# Patient Record
Sex: Male | Born: 1995
Health system: Southern US, Community
[De-identification: ages and names within clinical notes are randomized; demographics above are authoritative.]

## PROBLEM LIST (undated history)

## (undated) DIAGNOSIS — Z9889 Other specified postprocedural states: Secondary | ICD-10-CM

## (undated) DIAGNOSIS — I10 Essential (primary) hypertension: Secondary | ICD-10-CM

## (undated) DIAGNOSIS — E119 Type 2 diabetes mellitus without complications: Secondary | ICD-10-CM

## (undated) HISTORY — PX: TONSILLECTOMY: SUR1361

## (undated) HISTORY — PX: SHOULDER SURGERY: SHX246

## (undated) HISTORY — PX: OTHER SURGICAL HISTORY: SHX169

## (undated) HISTORY — PX: FIBULA FRACTURE SURGERY: SHX947

---
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
## 2014-02-19 ENCOUNTER — Ambulatory Visit (INDEPENDENT_AMBULATORY_CARE_PROVIDER_SITE_OTHER): Payer: Managed Care, Other (non HMO) | Admitting: Family Medicine

## 2014-02-19 ENCOUNTER — Encounter: Payer: Self-pay | Admitting: Family Medicine

## 2014-02-19 DIAGNOSIS — L03119 Cellulitis of unspecified part of limb: Secondary | ICD-10-CM

## 2014-02-19 DIAGNOSIS — L02419 Cutaneous abscess of limb, unspecified: Secondary | ICD-10-CM

## 2014-02-19 MED ORDER — DOXYCYCLINE HYCLATE 100 MG PO CAPS: 100.0000 mg | ORAL_CAPSULE | Freq: Two times a day (BID) | ORAL | Status: DC

## 2014-02-19 NOTE — Progress Notes (Signed)
   Subjective:    Patient ID: Garrett Lee, male    DOB: 07/27/1996, 18 y.o.   MRN: [id number]  HPI sytarted Sat worse on Sunday Upper left leg near groin Drained yesterday No fever He has not had any pain similar to this recently but he was concerned about a staph infection  Review of Systems Denies fevers vomiting sweats chills denies other problems    Objective:   Physical Exam  On examination he has an abscess in the left upper thigh near the groin it is adequately drained there is also to of area approximately three-eighths of an inch in diameter.      Assessment & Plan:  Abscess the area that is draining doxycycline twice a day for the next 10 days culture recommended proper way to take medication recommended also recommended to do warm compresses frequently if any problems followup

## 2014-02-21 ENCOUNTER — Ambulatory Visit: Payer: Self-pay | Admitting: Family Medicine

## 2014-02-22 LAB — WOUND CULTURE
GRAM STAIN: NONE SEEN
Gram Stain: NONE SEEN

## 2014-02-22 NOTE — Progress Notes (Signed)
Card sent 

## 2014-07-11 ENCOUNTER — Encounter: Payer: Self-pay | Admitting: Family Medicine

## 2014-07-11 ENCOUNTER — Ambulatory Visit (INDEPENDENT_AMBULATORY_CARE_PROVIDER_SITE_OTHER): Payer: Managed Care, Other (non HMO) | Admitting: Family Medicine

## 2014-07-11 VITALS — BP 120/80 | Ht 71.0 in | Wt 227.6 lb

## 2014-07-11 DIAGNOSIS — Z0000 Encounter for general adult medical examination without abnormal findings: Secondary | ICD-10-CM

## 2014-07-11 DIAGNOSIS — Z0189 Encounter for other specified special examinations: Secondary | ICD-10-CM

## 2014-07-11 DIAGNOSIS — R109 Unspecified abdominal pain: Secondary | ICD-10-CM

## 2014-07-11 LAB — CBC WITH DIFFERENTIAL/PLATELET
Basophils Absolute: 0.1 10*3/uL (ref 0.0–0.1)
Basophils Relative: 1 % (ref 0–1)
EOS ABS: 0.6 10*3/uL (ref 0.0–0.7)
EOS PCT: 7 % — AB (ref 0–5)
HCT: 49.6 % (ref 39.0–52.0)
Hemoglobin: 17.4 g/dL — ABNORMAL HIGH (ref 13.0–17.0)
LYMPHS ABS: 3.4 10*3/uL (ref 0.7–4.0)
Lymphocytes Relative: 38 % (ref 12–46)
MCH: 30.7 pg (ref 26.0–34.0)
MCHC: 35.1 g/dL (ref 30.0–36.0)
MCV: 87.6 fL (ref 78.0–100.0)
MONO ABS: 0.6 10*3/uL (ref 0.1–1.0)
MONOS PCT: 7 % (ref 3–12)
Neutro Abs: 4.2 10*3/uL (ref 1.7–7.7)
Neutrophils Relative %: 47 % (ref 43–77)
PLATELETS: 204 10*3/uL (ref 150–400)
RBC: 5.66 MIL/uL (ref 4.22–5.81)
RDW: 13.7 % (ref 11.5–15.5)
WBC: 8.9 10*3/uL (ref 4.0–10.5)

## 2014-07-11 LAB — HEPATIC FUNCTION PANEL
ALT: 59 U/L — ABNORMAL HIGH (ref 0–53)
AST: 26 U/L (ref 0–37)
Albumin: 4.7 g/dL (ref 3.5–5.2)
Alkaline Phosphatase: 95 U/L (ref 39–117)
Bilirubin, Direct: 0.1 mg/dL (ref 0.0–0.3)
Indirect Bilirubin: 0.4 mg/dL (ref 0.2–1.2)
Total Bilirubin: 0.5 mg/dL (ref 0.2–1.2)
Total Protein: 6.9 g/dL (ref 6.0–8.3)

## 2014-07-11 LAB — LIPASE: Lipase: 56 U/L (ref 0–75)

## 2014-07-11 MED ORDER — PANTOPRAZOLE SODIUM 40 MG PO TBEC: 40.0000 mg | DELAYED_RELEASE_TABLET | Freq: Every day | ORAL | Status: DC

## 2014-07-11 NOTE — Progress Notes (Signed)
   Subjective:    Patient ID: Garrett Lee, male    DOB: 23-Apr-1996, 18 y.o.   MRN: [id number]  HPI Patient arrives to have immunization forms filled out for college. Patient does relate intermittent abdominal pain discomfort. Denies high fever chills nausea vomiting diarrhea. Relates that whenever he eats he gets nausea with it relates mild epigastric pain no guarding or rebound.  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  Review of Systems Overall review of systems negative no cough either chills diarrhea or vomiting just nausea when he eats occasional epigastric pain    Objective:   Physical Exam Lungs clear heart regular pulse normal BP good abdomen soft extremities no edema skin warm dry HEENT benign       Assessment & Plan:  Gastritis-PPI prescribed. Lab work ordered. Await the results.  Wellness-safety measures dietary measures all discussed. Followup ongoing troubles. Up-to-date on immunizations. Need to do lab work to see if he has immunity to hepatitis B as well as parasellar

## 2014-07-12 LAB — TISSUE TRANSGLUTAMINASE, IGA: Tissue Transglutaminase Ab, IgA: 4.5 U/mL (ref <20)

## 2014-07-12 LAB — HEPATITIS B SURFACE ANTIBODY,QUALITATIVE: HEP B S AB: NEGATIVE

## 2014-07-12 LAB — VARICELLA ZOSTER ANTIBODY, IGG: VARICELLA IGG: 3224 {index} — AB (ref <135.00)

## 2014-07-12 LAB — TISSUE TRANSGLUTAMINASE, IGG: Tissue Transglut Ab: 4.9 U/mL (ref <20)

## 2014-07-15 NOTE — Progress Notes (Signed)
Patient notified and verbalized understanding. 

## 2015-02-13 ENCOUNTER — Telehealth: Payer: Self-pay | Admitting: Family Medicine

## 2015-02-13 ENCOUNTER — Emergency Department (HOSPITAL_COMMUNITY)
Admission: EM | Admit: 2015-02-13 | Discharge: 2015-02-14 | Disposition: A | Discharge status: Home / Self Care | Payer: Managed Care, Other (non HMO) | Attending: Emergency Medicine | Admitting: Emergency Medicine

## 2015-02-13 ENCOUNTER — Emergency Department (HOSPITAL_COMMUNITY): Payer: Managed Care, Other (non HMO)

## 2015-02-13 ENCOUNTER — Encounter: Payer: Self-pay | Admitting: Family Medicine

## 2015-02-13 ENCOUNTER — Encounter (HOSPITAL_COMMUNITY): Payer: Self-pay

## 2015-02-13 DIAGNOSIS — Z720 Tobacco use: Secondary | ICD-10-CM | POA: Insufficient documentation

## 2015-02-13 DIAGNOSIS — R1084 Generalized abdominal pain: Secondary | ICD-10-CM | POA: Diagnosis not present

## 2015-02-13 DIAGNOSIS — R112 Nausea with vomiting, unspecified: Secondary | ICD-10-CM | POA: Insufficient documentation

## 2015-02-13 DIAGNOSIS — R109 Unspecified abdominal pain: Secondary | ICD-10-CM

## 2015-02-13 DIAGNOSIS — Z79899 Other long term (current) drug therapy: Secondary | ICD-10-CM | POA: Insufficient documentation

## 2015-02-13 DIAGNOSIS — R197 Diarrhea, unspecified: Secondary | ICD-10-CM | POA: Insufficient documentation

## 2015-02-13 LAB — URINALYSIS, ROUTINE W REFLEX MICROSCOPIC
Bilirubin Urine: NEGATIVE
Glucose, UA: NEGATIVE mg/dL
Hgb urine dipstick: NEGATIVE
Ketones, ur: NEGATIVE mg/dL
LEUKOCYTES UA: NEGATIVE
Nitrite: NEGATIVE
PH: 6 (ref 5.0–8.0)
Protein, ur: NEGATIVE mg/dL
Specific Gravity, Urine: >1.03 — ABNORMAL HIGH (ref 1.005–1.030)
UROBILINOGEN UA: 0.2 mg/dL (ref 0.0–1.0)

## 2015-02-13 LAB — BASIC METABOLIC PANEL
ANION GAP: 9 (ref 5–15)
BUN: 16 mg/dL (ref 6–23)
CO2: 23 mmol/L (ref 19–32)
Calcium: 9.1 mg/dL (ref 8.4–10.5)
Chloride: 106 mmol/L (ref 96–112)
Creatinine, Ser: 0.97 mg/dL (ref 0.50–1.35)
GFR calc non Af Amer: >90 mL/min (ref >90)
Glucose, Bld: 151 mg/dL — ABNORMAL HIGH (ref 70–99)
Potassium: 3.7 mmol/L (ref 3.5–5.1)
SODIUM: 138 mmol/L (ref 135–145)

## 2015-02-13 LAB — HEPATIC FUNCTION PANEL
ALBUMIN: 4.8 g/dL (ref 3.5–5.2)
ALT: 65 U/L — ABNORMAL HIGH (ref 0–53)
AST: 36 U/L (ref 0–37)
Alkaline Phosphatase: 89 U/L (ref 39–117)
Bilirubin, Direct: 0.1 mg/dL (ref 0.0–0.5)
Indirect Bilirubin: 0.8 mg/dL (ref 0.3–0.9)
Total Bilirubin: 0.9 mg/dL (ref 0.3–1.2)
Total Protein: 7.4 g/dL (ref 6.0–8.3)

## 2015-02-13 LAB — CBC WITH DIFFERENTIAL/PLATELET
BASOS PCT: 0 % (ref 0–1)
Basophils Absolute: 0 10*3/uL (ref 0.0–0.1)
Eosinophils Absolute: 0.1 10*3/uL (ref 0.0–0.7)
Eosinophils Relative: 0 % (ref 0–5)
HEMATOCRIT: 50.9 % (ref 39.0–52.0)
HEMOGLOBIN: 17.9 g/dL — AB (ref 13.0–17.0)
LYMPHS ABS: 0.4 10*3/uL — AB (ref 0.7–4.0)
Lymphocytes Relative: 3 % — ABNORMAL LOW (ref 12–46)
MCH: 31.4 pg (ref 26.0–34.0)
MCHC: 35.2 g/dL (ref 30.0–36.0)
MCV: 89.3 fL (ref 78.0–100.0)
Monocytes Absolute: 1.2 10*3/uL — ABNORMAL HIGH (ref 0.1–1.0)
Monocytes Relative: 7 % (ref 3–12)
NEUTROS ABS: 15.7 10*3/uL — AB (ref 1.7–7.7)
NEUTROS PCT: 90 % — AB (ref 43–77)
Platelets: 186 10*3/uL (ref 150–400)
RBC: 5.7 MIL/uL (ref 4.22–5.81)
RDW: 12.6 % (ref 11.5–15.5)
WBC: 17.4 10*3/uL — ABNORMAL HIGH (ref 4.0–10.5)

## 2015-02-13 LAB — LIPASE, BLOOD: LIPASE: 26 U/L (ref 11–59)

## 2015-02-13 MED ORDER — SODIUM CHLORIDE 0.9 % IV SOLN: INTRAVENOUS | Status: DC

## 2015-02-13 MED ORDER — DICYCLOMINE HCL 20 MG PO TABS: 20.0000 mg | ORAL_TABLET | Freq: Four times a day (QID) | ORAL | Status: DC | PRN

## 2015-02-13 MED ORDER — FAMOTIDINE IN NACL 20-0.9 MG/50ML-% IV SOLN
20.0000 mg | Freq: Once | INTRAVENOUS | Status: AC
  Administered 2015-02-13: 20 mg via INTRAVENOUS
  Filled 2015-02-13: qty 50

## 2015-02-13 MED ORDER — ONDANSETRON HCL 4 MG PO TABS: 4.0000 mg | ORAL_TABLET | Freq: Three times a day (TID) | ORAL | Status: DC | PRN

## 2015-02-13 MED ORDER — ONDANSETRON HCL 4 MG/2ML IJ SOLN
4.0000 mg | INTRAMUSCULAR | Status: DC | PRN
  Administered 2015-02-13: 4 mg via INTRAVENOUS
  Filled 2015-02-13: qty 2

## 2015-02-13 MED ORDER — PROMETHAZINE HCL 25 MG PO TABS: 25.0000 mg | ORAL_TABLET | Freq: Three times a day (TID) | ORAL | Status: DC | PRN

## 2015-02-13 MED ORDER — IOHEXOL 300 MG/ML  SOLN
50.0000 mL | Freq: Once | INTRAMUSCULAR | Status: AC | PRN
  Administered 2015-02-13: 50 mL via ORAL

## 2015-02-13 MED ORDER — SODIUM CHLORIDE 0.9 % IV BOLUS (SEPSIS)
1000.0000 mL | Freq: Once | INTRAVENOUS | Status: AC
  Administered 2015-02-13: 1000 mL via INTRAVENOUS

## 2015-02-13 MED ORDER — IOHEXOL 300 MG/ML  SOLN
100.0000 mL | Freq: Once | INTRAMUSCULAR | Status: AC | PRN
  Administered 2015-02-13: 100 mL via INTRAVENOUS

## 2015-02-13 NOTE — Telephone Encounter (Signed)
Patient has contracted the stomach virus that his daughter [facility] was seen for earlier this week.  Can Thurmond ButtsWade have an excuse for college today?

## 2015-02-13 NOTE — Telephone Encounter (Signed)
Patient is having vomiting,diarrhea and nausea.

## 2015-02-13 NOTE — ED Provider Notes (Signed)
CSN: [id number]     Arrival date & time 02/13/15  1839 History   First MD Initiated Contact with Patient 02/13/15 2136     Chief Complaint  Patient presents with  . Emesis  . Abdominal Pain     HPI Pt was seen at 2145.  Per pt, c/o gradual onset and persistence of constant generalized abd "pain" since yesterday.  Has been associated with multiple intermittent episodes of N/V as well as generalized body "aches."  Describes the abd pain as generalized "cramping."  Denies diarrhea, no fevers, no back pain, no rash, no CP/SOB, no black or blood in stools or emesis.       History reviewed. No pertinent past medical history.   Past Surgical History  Procedure Laterality Date  . Fibula fracture surgery    . Shoulder surgery    . Tubes in ears      History  Substance Use Topics  . Smoking status: Current Every Day Smoker  . Smokeless tobacco: Not on file  . Alcohol Use: No    Review of Systems ROS: Statement: All systems negative except as marked or noted in the HPI; Constitutional: Negative for fever and chills. +generalized body aches.; ; Eyes: Negative for eye pain, redness and discharge. ; ; ENMT: Negative for ear pain, hoarseness, nasal congestion, sinus pressure and sore throat. ; ; Cardiovascular: Negative for chest pain, palpitations, diaphoresis, dyspnea and peripheral edema. ; ; Respiratory: Negative for cough, wheezing and stridor. ; ; Gastrointestinal: +N/V, abd pain. Negative for diarrhea, blood in stool, hematemesis, jaundice and rectal bleeding. . ; ; Genitourinary: Negative for dysuria, flank pain and hematuria. ; ; Musculoskeletal: Negative for back pain and neck pain. Negative for swelling and trauma.; ; Skin: Negative for pruritus, rash, abrasions, blisters, bruising and skin lesion.; ; Neuro: Negative for headache, lightheadedness and neck stiffness. Negative for weakness, altered level of consciousness , altered mental status, extremity weakness, paresthesias, involuntary  movement, seizure and syncope.     Allergies  Review of patient's allergies indicates no known allergies.  Home Medications   Prior to Admission medications   Medication Sig Start Date End Date Taking? Authorizing Provider  promethazine (PHENERGAN) 25 MG tablet Take 1 tablet (25 mg total) by mouth every 8 (eight) hours as needed for nausea or vomiting. 02/13/15  Yes Babs Sciara, MD  pantoprazole (PROTONIX) 40 MG tablet Take 1 tablet (40 mg total) by mouth daily. 07/11/14   Babs Sciara, MD   BP 128/85 mmHg  Pulse 109  Temp(Src) 99.7 F (37.6 C) (Oral)  Resp 17  Ht 6' (1.829 m)  Wt 225 lb (102.059 kg)  BMI 30.51 kg/m2  SpO2 96% Physical Exam  2150: Physical examination:  Nursing notes reviewed; Vital signs and O2 SAT reviewed;  Constitutional: Well developed, Well nourished, Well hydrated, In no acute distress; Head:  Normocephalic, atraumatic; Eyes: EOMI, PERRL, No scleral icterus; ENMT: Mouth and pharynx normal, Mucous membranes moist; Neck: Supple, Full range of motion, No lymphadenopathy; Cardiovascular: Regular rate and rhythm, No murmur, rub, or gallop; Respiratory: Breath sounds clear & equal bilaterally, No rales, rhonchi, wheezes.  Speaking full sentences with ease, Normal respiratory effort/excursion; Chest: Nontender, Movement normal; Abdomen: Soft, +generalized tenderness to palp. No rebound or guarding. Nondistended, Normal bowel sounds; Genitourinary: No CVA tenderness; Extremities: Pulses normal, No tenderness, No edema, No calf edema or asymmetry.; Neuro: AA&Ox3, Major CN grossly intact.  Speech clear. No gross focal motor or sensory deficits in extremities.; Skin: Color normal,  Warm, Dry.   ED Course  Procedures      EKG Interpretation None      MDM  MDM Reviewed: previous chart, nursing note and vitals Reviewed previous: labs Interpretation: labs and CT scan      Results for orders placed or performed during the hospital encounter of 02/13/15  CBC  with Differential  Result Value Ref Range   WBC 17.4 (H) 4.0 - 10.5 K/uL   RBC 5.70 4.22 - 5.81 MIL/uL   Hemoglobin 17.9 (H) 13.0 - 17.0 g/dL   HCT 16.150.9 09.639.0 - 04.552.0 %   MCV 89.3 78.0 - 100.0 fL   MCH 31.4 26.0 - 34.0 pg   MCHC 35.2 30.0 - 36.0 g/dL   RDW 40.912.6 81.111.5 - 91.415.5 %   Platelets 186 150 - 400 K/uL   Neutrophils Relative % 90 (H) 43 - 77 %   Neutro Abs 15.7 (H) 1.7 - 7.7 K/uL   Lymphocytes Relative 3 (L) 12 - 46 %   Lymphs Abs 0.4 (L) 0.7 - 4.0 K/uL   Monocytes Relative 7 3 - 12 %   Monocytes Absolute 1.2 (H) 0.1 - 1.0 K/uL   Eosinophils Relative 0 0 - 5 %   Eosinophils Absolute 0.1 0.0 - 0.7 K/uL   Basophils Relative 0 0 - 1 %   Basophils Absolute 0.0 0.0 - 0.1 K/uL  Basic metabolic panel  Result Value Ref Range   Sodium 138 135 - 145 mmol/L   Potassium 3.7 3.5 - 5.1 mmol/L   Chloride 106 96 - 112 mmol/L   CO2 23 19 - 32 mmol/L   Glucose, Bld 151 (H) 70 - 99 mg/dL   BUN 16 6 - 23 mg/dL   Creatinine, Ser 7.820.97 0.50 - 1.35 mg/dL   Calcium 9.1 8.4 - 95.610.5 mg/dL   GFR calc non Af Amer >90 >90 mL/min   GFR calc Af Amer >90 >90 mL/min   Anion gap 9 5 - 15  Urinalysis, Routine w reflex microscopic  Result Value Ref Range   Color, Urine YELLOW YELLOW   APPearance CLEAR CLEAR   Specific Gravity, Urine >1.030 (H) 1.005 - 1.030   pH 6.0 5.0 - 8.0   Glucose, UA NEGATIVE NEGATIVE mg/dL   Hgb urine dipstick NEGATIVE NEGATIVE   Bilirubin Urine NEGATIVE NEGATIVE   Ketones, ur NEGATIVE NEGATIVE mg/dL   Protein, ur NEGATIVE NEGATIVE mg/dL   Urobilinogen, UA 0.2 0.0 - 1.0 mg/dL   Nitrite NEGATIVE NEGATIVE   Leukocytes, UA NEGATIVE NEGATIVE  Hepatic function panel  Result Value Ref Range   Total Protein 7.4 6.0 - 8.3 g/dL   Albumin 4.8 3.5 - 5.2 g/dL   AST 36 0 - 37 U/L   ALT 65 (H) 0 - 53 U/L   Alkaline Phosphatase 89 39 - 117 U/L   Total Bilirubin 0.9 0.3 - 1.2 mg/dL   Bilirubin, Direct 0.1 0.0 - 0.5 mg/dL   Indirect Bilirubin 0.8 0.3 - 0.9 mg/dL  Lipase, blood  Result  Value Ref Range   Lipase 26 11 - 59 U/L   Ct Abdomen Pelvis W Contrast 02/13/2015   CLINICAL DATA:  Nausea vomiting diarrhea and body aches, 1 day duration.  EXAM: CT ABDOMEN AND PELVIS WITH CONTRAST  TECHNIQUE: Multidetector CT imaging of the abdomen and pelvis was performed using the standard protocol following bolus administration of intravenous contrast.  CONTRAST:  100mL OMNIPAQUE IOHEXOL 300 MG/ML SOLN, 50mL OMNIPAQUE IOHEXOL 300 MG/ML SOLN  COMPARISON:  None  FINDINGS: There is generalized fatty infiltration of the liver. Gallbladder and bile ducts appear normal. The pancreas appears normal. Spleen, adrenals and kidneys appear normal.  There is mild distention of the colon with air and liquid stool. There is no CT evidence of colitis. Appendix is normal. Small bowel is normal in appearance. The oral contrast has progressed through to the rectum. There is no bowel obstruction. There is no extraluminal air. No focal inflammatory changes are evident in the abdomen or pelvis. No abnormalities are evident in the lower chest.  IMPRESSION: Distention of the colon with air and liquid stool.  Fatty liver.   Electronically Signed   By: Ellery Plunk M.D.   On: 02/13/2015 23:39    2355:  LFT's per baseline. WBC elevated, but workup otherwise reassuring. Pt has tol PO well while in the ED without N/V.  No stooling while in the ED.  Abd benign, VSS. Feels better and wants to go home now. Dx and testing d/w pt and family.  Questions answered.  Verb understanding, agreeable to d/c home with outpt f/u.    Samuel Jester, DO 02/16/15 [phone]

## 2015-02-13 NOTE — Telephone Encounter (Signed)
Yes, if ongoing problems or issues let us know

## 2015-02-13 NOTE — ED Notes (Signed)
Pt reports n/v/d and cramping x 24 hours.  Pt says aches everywhere.

## 2015-02-13 NOTE — Telephone Encounter (Signed)
Mom notified and verbalized understanding.

## 2015-02-13 NOTE — Telephone Encounter (Signed)
Pt does need something for his stomach if we can go ahead an call that in please  [facility] [employer]

## 2015-02-13 NOTE — Telephone Encounter (Signed)
The most effective medicine for the nausea would be Phenergan 25 mg tablet. Half tablet to a whole tablet every 8 hours when necessary nausea caution drowsiness, #15. (If the patient does not feel he can keep this down would rather use a suppository that can be done as well. The instructions for that is 1 suppository, 25 mg, every 6 hours when necessary nausea, caution drowsiness, #12.) Very important to let the patient know that if he is not turning the corner over the next 24 hours he ought to be seen. The diarrhea pretty much will run its course over the next few days. Over-the-counter Imodium may be used. If bloody stools or high fever or inability to keep things down over the next 24 hours then he needs to be seen right away

## 2015-02-13 NOTE — Discharge Instructions (Signed)
°Emergency Department Resource Guide °1) Find a Doctor and Pay Out of Pocket °Although you won't have to find out who is covered by your insurance plan, it is a good idea to ask around and get recommendations. You will then need to call the office and see if the doctor you have chosen will accept you as a new patient and what types of options they offer for patients who are self-pay. Some doctors offer discounts or will set up payment plans for their patients who do not have insurance, but you will need to ask so you aren't surprised when you get to your appointment. ° °2) Contact Your Local Health Department °Not all health departments have doctors that can see patients for sick visits, but many do, so it is worth a call to see if yours does. If you don't know where your local health department is, you can check in your phone book. The CDC also has a tool to help you locate your state's health department, and many state websites also have listings of all of their local health departments. ° °3) Find a Walk-in Clinic °If your illness is not likely to be very severe or complicated, you may want to try a walk in clinic. These are popping up all over the country in pharmacies, drugstores, and shopping centers. They're usually staffed by nurse practitioners or physician assistants that have been trained to treat common illnesses and complaints. They're usually fairly quick and inexpensive. However, if you have serious medical issues or chronic medical problems, these are probably not your best option. ° °No Primary Care Doctor: °- Call [facility] at  832-8000 - they can help you locate a primary care doctor that  accepts your insurance, provides certain services, etc. °- Physician Referral Service- 1-800-533-3463 ° °Chronic Pain Problems: °Organization         Address  Phone   Notes  °[facility]  (336) 297-2271 Patients need to be referred by their primary care doctor.  ° °Medication  Assistance: °Organization         Address  Phone   Notes  °[place] [facility] [address] °[place], [place] [address] (336) 641-8030 --Must be a resident of [place] °-- Must have NO insurance coverage whatsoever (no Medicaid/ Medicare, etc.) °-- The pt. MUST have a primary care doctor that directs their care regularly and follows them in the community °  °[facility]  (866) 331-1348   °[employer]  (888) 892-1162   ° °Agencies that provide inexpensive medical care: °Organization         Address  Phone   Notes  °[facility]  (336) 832-8035   °[facility]    (336) 832-7272   °[facility] [address] °[place], [place] [address] (336) 832-4777   °[facility] [address], °[place] (336) 271-4999   °[facility]    (336) 373-0678   °[facility]    (336) 272-1050   °[facility] ° [address], [place] Phone:  (336) 832-4444, Fax:  (336) 832-4440 Hours of Operation:  9 am - 6 pm, M-F.  Also accepts Medicaid/Medicare and self-pay.  °[facility] ° [address],  Phone: (336) 832-3150, Fax: (336) 832-3151. Hours of Operation:  8:30 am - 5:30 pm, M-F.  Also accepts Medicaid and self-pay.  °[facility] [place] [address], [place] Phone: (336) 878-6027   °[facility] [address], [place], [place] (336)723-1848, Ext. 123 Mondays & Thursdays: 7-9 AM.  First 15 patients are seen on a first come, first serve basis. °  ° °Medicaid-accepting [place] Providers: ° °Organization         Address  Phone   Notes  °[facility] [address], [place] (336) 641-2100 Also accepts self-pay patients.  °[facility] [address], [place] ° (336) 856-9996   °[facility] [address], [place]  (336) 288-8857   °[facility] [address] [place] [address], [place] (336) 299-7000   °Veita Bland [address], [place]  ° (336) 373-1557 Only accepts [place] Access Medicaid patients after they have their name applied to their card.  ° °Self-Pay (no insurance) in [place]: ° °Organization         Address  Phone   Notes  °Sickle Cell Patients, [facility] [address], [place] (336) 832-1970   °[facility] [address], [place] (336) 832-4400   °[facility] [place] ° [address], [place] (336) 992-4800   °[facility]/Dr. Osei-Bonsu ° [address] [place] [address], [place] or [address], [place] (336) 841-8500 Phone number for both [place] and [place] locations is the same.  °[facility] [address], [place] (336) 299-0000   °[facility] [place] [address] [place] [address], [place] or [address] (336) 852-7530 °(336) 878-2260   °[facility] [address], [place] (336) 350-1642, phone; (336) 294-5005, fax Sees patients 1st and 3rd Saturday of every month.  Must not qualify for public or private insurance (i.e. Medicaid, Medicare, [place] Health Choice, Veterans' Benefits) • Household income should be no more than 200% of the poverty level •The clinic cannot treat you if you are pregnant or think you are pregnant • Sexually transmitted diseases are not treated at the clinic.  ° ° °Dental Care: °Organization         Address  Phone  Notes  °[place] [facility] [address], [place] (336) 641-6152 Accepts children up to age 21 who are enrolled in Medicaid or [place] Health Choice; pregnant women with a Medicaid card; and children who have applied for Medicaid or [place] Health Choice, but were declined, whose parents can pay a reduced fee at time of service.  °[place]  [facility] [place]  [address], [place] (336) 641-7733 Accepts children up to age 21 who are enrolled in Medicaid or [place] Health Choice; pregnant women with a Medicaid card; and children who have applied for Medicaid or [place] Health Choice, but were declined, whose parents can pay a reduced fee at time of service.  °[facility] ° [address], [place] (336) 641-4533 Patients are seen by appointment only. Walk-ins are not accepted. [facility] will see patients 18 years of age and older. °Monday - Tuesday (8am-5pm) °Most Wednesdays (8:30-5pm) °$30 per visit, cash only  °[facility] ° [address], [place] (336) 641-4533 Patients are seen by appointment only. Walk-ins are not accepted. [facility] will see patients 18 years of age and older. °One   Wednesday Evening (Monthly: Volunteer Based).  $30 per visit, cash only  °[facility]  (919) 537-3737 for adults; Children under age 4, call Graduate Pediatric Dentistry at (919) [phone]. Children aged 4-14, please call (919) [phone] to request a pediatric application. ° Dental services are provided in all areas of dental care including fillings, crowns and bridges, complete and partial dentures, implants, gum treatment, root canals, and extractions. Preventive care is also provided. Treatment is provided to both adults and children. °Patients are selected via a lottery and there is often a waiting list. °  °[facility] [address], °[place] ° (336) 763-8833 [email or web address] °  °[facility] [address], [place], [place] (336)723-1848, Ext. 123 Second and Fourth Thursday of each month, opens at 6:30 AM; Clinic ends at 9 AM.  Patients are seen on a first-come first-served basis, and a limited number are seen during each clinic.  ° °[facility] ° [address], [place], [place] (336) 723-7904    Eligibility Requirements °You must have lived in [place], or [place] counties for at least the last three months. °  You cannot be eligible for state or federal sponsored healthcare insurance, including Veterans Administration, Medicaid, or Medicare. °  You generally cannot be eligible for healthcare insurance through your employer.  °  How to apply: °Eligibility screenings are held every Tuesday and Wednesday afternoon from 1:00 pm until 4:00 pm. You do not need an appointment for the interview!  °[facility] [address], [place], [place] 336-631-2330   °[facility]  336-342-8273   °[facility]  336-703-3100   °[facility]  336-570-6415   ° °Behavioral Health Resources in the Community: °Intensive Outpatient Programs °Organization         Address  Phone  Notes  °[facility] [address], [place], [place] 336-878-6098   °[facility] [address], [place], [place] 336-832-9800   °[facility] [address], [place], [place] ° 336-882-2125   °[facility] [address],  °[place], [place] 1-800-853-5163 or 336-641-4981   °Substance Abuse Resources °Organization         Address  Phone  Notes  °[facility]  336-882-2125   °[facility]  336-784-9470   °[facility]  336-285-9073   °[facility]  336-845-3988   °Residential & Outpatient Substance Abuse Program  1-800-659-3381   °Psychological Services °Organization         Address  Phone  Notes  °[facility]  336- 832-9600   °[facility]  336- 378-7881   °[facility] [address], [place] 1-800-853-5163 or 336-641-4981   ° °Mobile Crisis Teams °Organization         Address  Phone  Notes  °[facility], Mobile Crisis Care Unit  1-877-626-1772   °Assertive °[facility] ° [address]  [place], [place] 336-834-9664   °Sharon DeEsch [address] °[place] 336-554-5454   ° °Self-Help/Support Groups °Organization         Address  Phone             Notes  °[facility] - variety of support groups  336- 373-1402 Call for more information  °Narcotics Anonymous (NA), [facility] [address], °[place]  2 meetings at this location  ° °  Residential Treatment Programs °Organization         Address  Phone  Notes  °[facility] [address],    °[place]  1-866-801-8205   °[facility] ° [address], [place], [place] 704-293-8524   °[facility] [address], [place] 336-845-3988 Admissions: 8am-3pm M-F  °[facility] [address]    °[place], [place] 336-841-1104   °[facility] [address], [place], [place] 336-379-7146   °[facility] [address]  °[place], [place] 336-285-9073   °[facility] Intensive Outpatient [address], [place], [place] 336-852-3033   °[facility] [address]  °[place], [place] 1-877-615-2722 or 336-784-9470   °[facility] [address] [place], [place] 336-227-7417 Accepts Medicaid  °[facility] [address]  ° [place] 1-800-659-3381 Substance Abuse/Addiction Treatment  ° °[place] Behavioral Health Resources °Organization         Address  Phone  Notes  °[facility]  (888) 581-9988   °Julie Brannon, PhD [address] [place], [place]   (336) 349-5553 or (336) 951-0000   °[facility]   [address] °[place], [place] (336) 349-4454   °[facility] [address], [place], [place] (336) 342-8316 Insurance/Medicaid/sponsorship through [facility]  °[facility] [address]                                    [place], [place] (336) 342-8316 Therapy/tele-psych/case    °[facility] [address]  ° [place], [place] (336) 349-2233    °Dr. Arfeen  (336) 349-4544   °[facility] [place]  [employer] [place] [facility] 1) [address], [place] °2) [address], [place] °3)  [address], [place] (336) 349-3220 °(336) 342-7768 ° °(336) 342-8140   °[place] [facility] (336) 342-1394 or (336) 342-3537 (After Hours)    ° ° °Take the prescriptions as directed.  Increase your fluid intake (ie:  Gatoraide) for the next few days, as discussed.  Eat a bland diet and advance to your regular diet slowly as you can tolerate it.  Avoid full strength juices, as well as milk and milk products until your diarrhea has resolved.   Call your regular medical doctor tomorrow to schedule a follow up appointment in the next 2 days.  Return to the Emergency Department immediately if not improving (or even worsening) despite taking the medicines as prescribed, any black or bloody stool or vomit, if you develop a fever over "101," or for any other concerns. ° °

## 2015-02-14 NOTE — ED Notes (Signed)
MD at bedside. 

## 2015-06-06 ENCOUNTER — Ambulatory Visit (INDEPENDENT_AMBULATORY_CARE_PROVIDER_SITE_OTHER): Payer: Managed Care, Other (non HMO) | Admitting: Family Medicine

## 2015-06-06 ENCOUNTER — Encounter: Payer: Self-pay | Admitting: Family Medicine

## 2015-06-06 VITALS — BP 128/90 | Temp 98.8°F | Ht 71.0 in | Wt 232.1 lb

## 2015-06-06 DIAGNOSIS — R740 Nonspecific elevation of levels of transaminase and lactic acid dehydrogenase [LDH]: Secondary | ICD-10-CM | POA: Diagnosis not present

## 2015-06-06 DIAGNOSIS — Z720 Tobacco use: Secondary | ICD-10-CM | POA: Diagnosis not present

## 2015-06-06 DIAGNOSIS — E669 Obesity, unspecified: Secondary | ICD-10-CM | POA: Diagnosis not present

## 2015-06-06 DIAGNOSIS — R7401 Elevation of levels of liver transaminase levels: Secondary | ICD-10-CM

## 2015-06-06 DIAGNOSIS — J019 Acute sinusitis, unspecified: Secondary | ICD-10-CM

## 2015-06-06 DIAGNOSIS — F17200 Nicotine dependence, unspecified, uncomplicated: Secondary | ICD-10-CM | POA: Insufficient documentation

## 2015-06-06 DIAGNOSIS — B9689 Other specified bacterial agents as the cause of diseases classified elsewhere: Secondary | ICD-10-CM

## 2015-06-06 MED ORDER — LEVOFLOXACIN 500 MG PO TABS: 500.0000 mg | ORAL_TABLET | Freq: Every day | ORAL | Status: DC

## 2015-06-06 NOTE — Progress Notes (Signed)
   Subjective:    Patient ID: Garrett Lee, male    DOB: 1996-04-21, 21 y.o.   MRN: [id number]  Sinusitis This is a new problem. The current episode started 1 to 4 weeks ago. The problem is unchanged. There has been no fever. The pain is moderate. Associated symptoms include congestion, coughing, headaches and sinus pressure. Past treatments include oral decongestants (Nyquil, Advil Cold and Sinus). The treatment provided no relief.   Patient states that he has no concerns at this time.     Review of Systems  HENT: Positive for congestion and sinus pressure.   Respiratory: Positive for cough.   Neurological: Positive for headaches.      history of smoking history of fatty liver Objective:   Physical Exam  Constitutional: He appears well-developed.  HENT:  Head: Normocephalic.  Mouth/Throat: Oropharynx is clear and moist. No oropharyngeal exudate.  Neck: Normal range of motion.  Cardiovascular: Normal rate, regular rhythm and normal heart sounds.   No murmur heard. Pulmonary/Chest: Effort normal and breath sounds normal. He has no wheezes.  Lymphadenopathy:    He has no cervical adenopathy.  Neurological: He exhibits normal muscle tone.  Skin: Skin is warm and dry.  Nursing note and vitals reviewed.         Assessment & Plan:  The patient was advised to quit smoking  Patient was encouraged to exercise watch diet closely  Patient was encouraged to get lab work and a follow-up office visit in the fall time Acute rhinosinusitis bacterial Levaquin 10 days as directed

## 2015-09-01 IMAGING — CT CT ABD-PELV W/ CM
2 of 4 series · 16 of 46 positions shown, 18 images · IV contrast (Omnipaque 300)
Comparison: None

CLINICAL DATA: Nausea vomiting diarrhea and body aches, 1 day
duration.

EXAM:
CT ABDOMEN AND PELVIS WITH CONTRAST
TECHNIQUE: Multidetector CT imaging of the abdomen and pelvis was performed
using the standard protocol following bolus administration of
intravenous contrast.
CONTRAST:  100mL OMNIPAQUE IOHEXOL 300 MG/ML SOLN, 50mL OMNIPAQUE
IOHEXOL 300 MG/ML SOLN

[Series 2: abd_pel_with 5.0 b40f · axial · 0.75mm/px · z∈[-561,-86]mm · 13 of 105 slices shown, 15 images]
[im 5/105  soft-tissue]
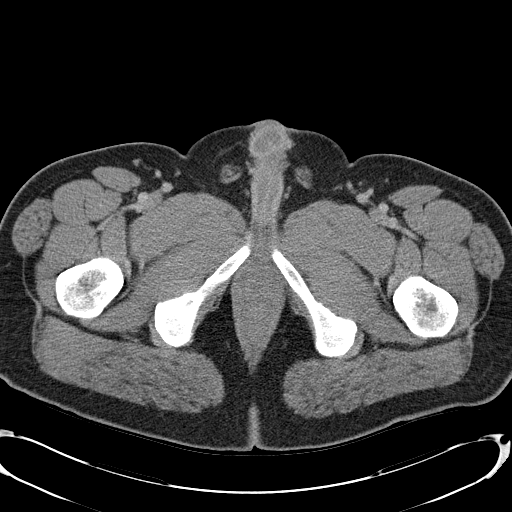
[im 5/105  bone]
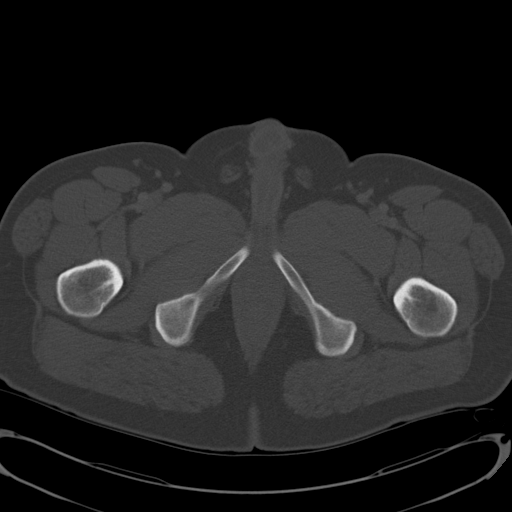
[im 13/105  soft-tissue]
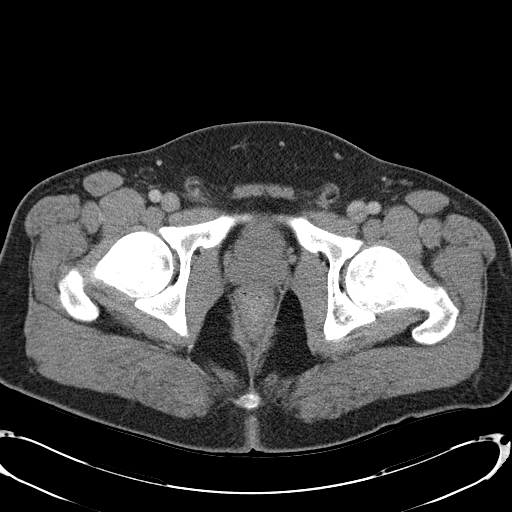
[im 21/105  soft-tissue]
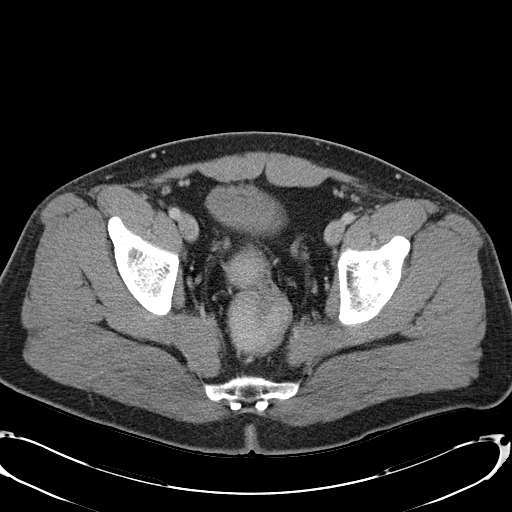
[im 30/105  soft-tissue]
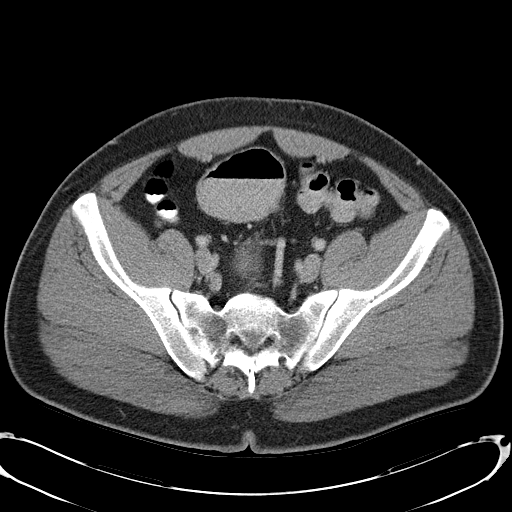
[im 38/105  soft-tissue]
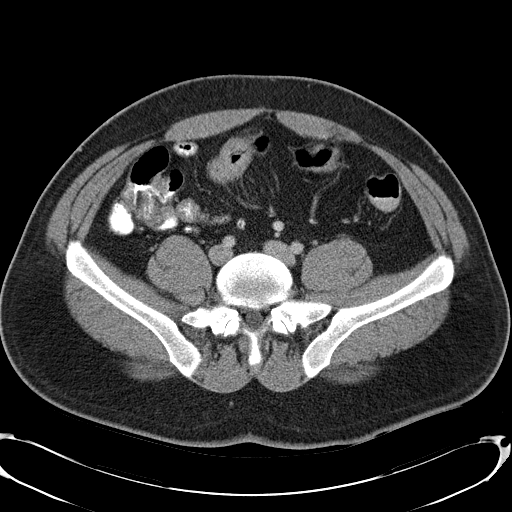
[im 46/105  soft-tissue]
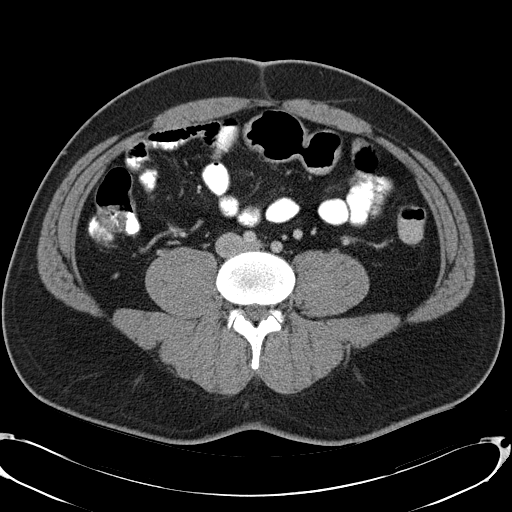
[im 55/105  soft-tissue]
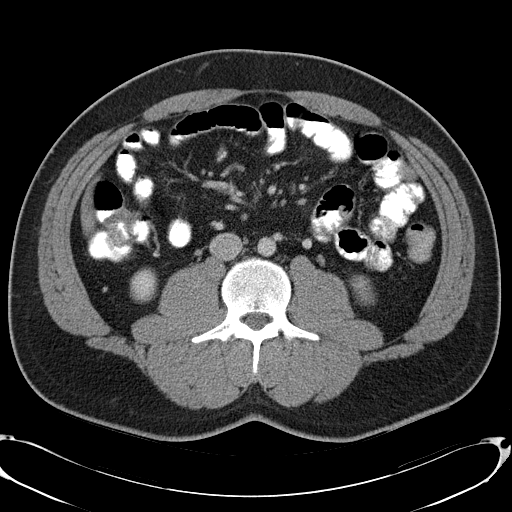
[im 59/105  soft-tissue]
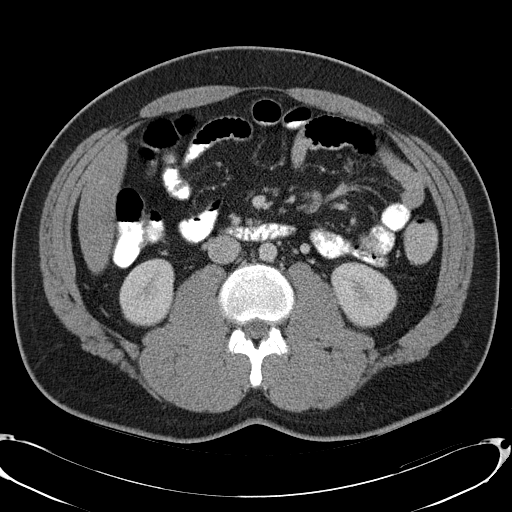
[im 67/105  soft-tissue]
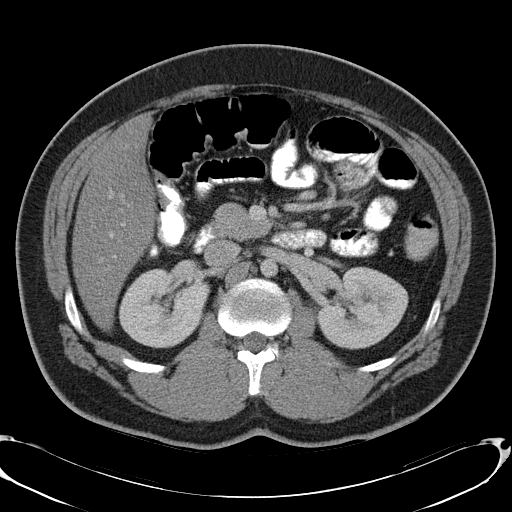
[im 67/105  bone]
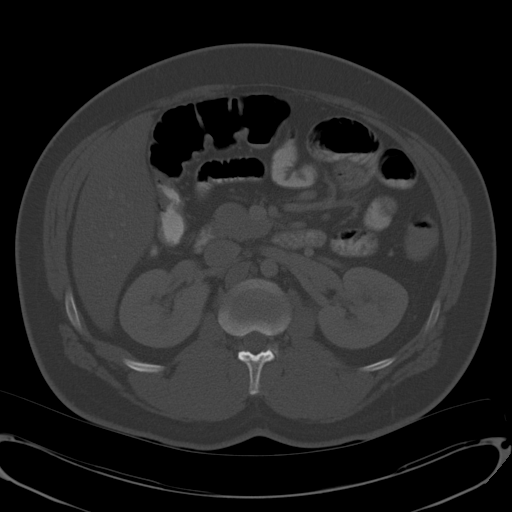
[im 75/105  soft-tissue]
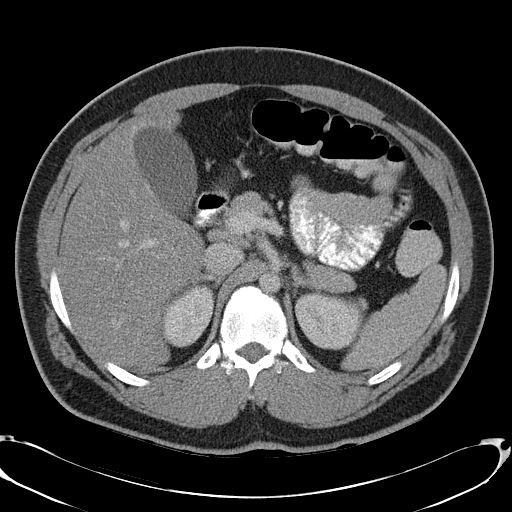
[im 84/105  soft-tissue]
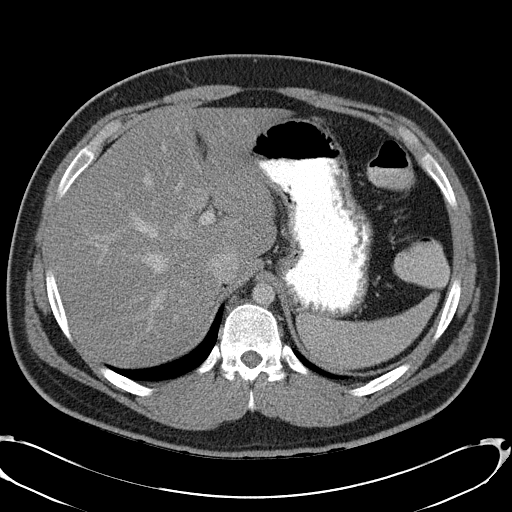
[im 92/105  soft-tissue]
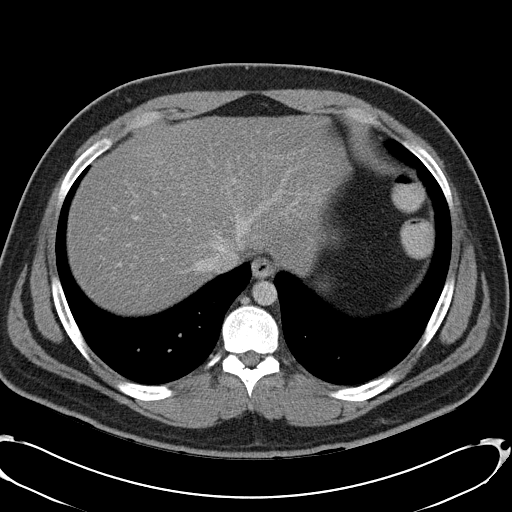
[im 100/105  soft-tissue]
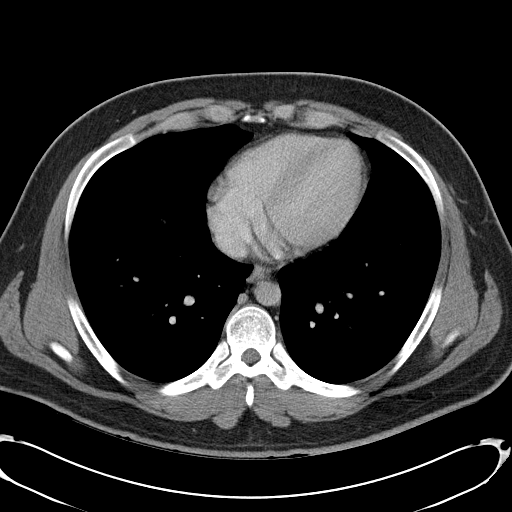

[Series 3: abd_pel_with 3.0 spo cor · coronal · 0.73mm/px · 3 of 97 slices shown]
[im 33/97  soft-tissue]
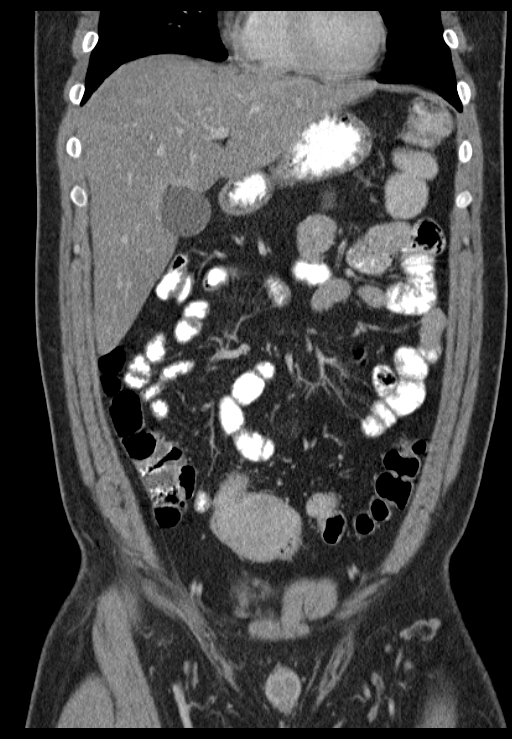
[im 43/97  soft-tissue]
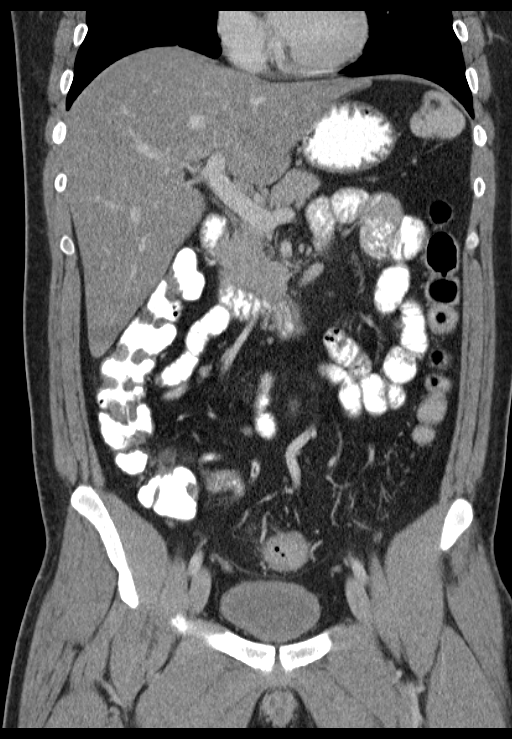
[im 54/97  soft-tissue]
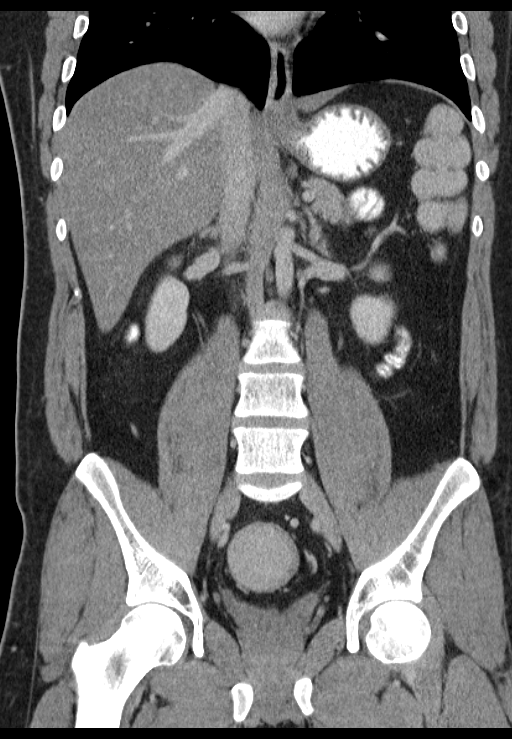

[16 of 46 positions shown; findings below may reference images not displayed]

FINDINGS: There is generalized fatty infiltration of the liver. Gallbladder
and bile ducts appear normal. The pancreas appears normal. Spleen,
adrenals and kidneys appear normal.

There is mild distention of the colon with air and liquid stool.
There is no CT evidence of colitis. Appendix is normal. Small bowel
is normal in appearance. The oral contrast has progressed through to
the rectum. There is no bowel obstruction. There is no extraluminal
air. No focal inflammatory changes are evident in the abdomen or
pelvis. No abnormalities are evident in the lower chest.
IMPRESSION: Distention of the colon with air and liquid stool.  Fatty liver.

## 2015-11-06 ENCOUNTER — Ambulatory Visit (INDEPENDENT_AMBULATORY_CARE_PROVIDER_SITE_OTHER): Payer: Managed Care, Other (non HMO) | Admitting: Family Medicine

## 2015-11-06 DIAGNOSIS — Z23 Encounter for immunization: Secondary | ICD-10-CM | POA: Diagnosis not present

## 2015-11-06 DIAGNOSIS — T148 Other injury of unspecified body region: Secondary | ICD-10-CM | POA: Diagnosis not present

## 2015-11-06 DIAGNOSIS — T148XXA Other injury of unspecified body region, initial encounter: Secondary | ICD-10-CM

## 2015-11-06 MED ORDER — AMOXICILLIN-POT CLAVULANATE 875-125 MG PO TABS: 1.0000 | ORAL_TABLET | Freq: Two times a day (BID) | ORAL | Status: DC

## 2015-11-06 NOTE — Progress Notes (Signed)
   Subjective:    Patient ID: Garrett Lee, male    DOB: 14-Dec-1996, 19 y.o.   MRN: [id number]  HPI  this patient states he was outside shooting arrows. The patient went to pull 1 out of a tree fell backwards landing on one it punctured his leg states that it when in approximately 1 inch but he isn't certain he describes some soreness but denies any numbness or weakness of the leg. His never had anything like this before. Uncertain of last tetanus shot. He did try to clean it out at home. No prior trouble.   Review of Systems  denies fever chills numbness weakness relates some soreness around puncture    Objective:   Physical Exam   puncture wound on the lower leg. Pulses in the foot are normal both anterior and posterior full function of the foot is normal. No loss of strength. Color looks good.      Assessment & Plan:   puncture wound was injected around the edge with 1% lidocaine without epinephrine. Patient gave consent. 30 mL syringe with 18-gauge needle used to squirt saline with Betadine mixed in multiple times at the opening. The needle was not inserted into the leg. There was no complications. Pulses were still good function of the leg still good.   antibiotics twice daily warm compresses frequently follow-up if ongoing troubles ibuprofen for pain. Tetanus shot given.   warning signs given regarding signs of infection. Family was encouraged to check pulses tonight and tomorrow if any problems call us if significant redness pain drainage discomfort. Referral / consultation at ER recommended. I doubt that the patient injured blood vessels with this puncture. No sign of nerve injury.

## 2016-09-01 ENCOUNTER — Encounter: Payer: Self-pay | Admitting: Family Medicine

## 2016-09-01 ENCOUNTER — Ambulatory Visit (INDEPENDENT_AMBULATORY_CARE_PROVIDER_SITE_OTHER): Payer: 59 | Admitting: Family Medicine

## 2016-09-01 VITALS — BP 138/88 | Ht 71.0 in | Wt 224.6 lb

## 2016-09-01 DIAGNOSIS — Z720 Tobacco use: Secondary | ICD-10-CM | POA: Diagnosis not present

## 2016-09-01 DIAGNOSIS — R030 Elevated blood-pressure reading, without diagnosis of hypertension: Secondary | ICD-10-CM | POA: Diagnosis not present

## 2016-09-01 DIAGNOSIS — IMO0001 Reserved for inherently not codable concepts without codable children: Secondary | ICD-10-CM

## 2016-09-01 LAB — POCT URINALYSIS DIPSTICK
Spec Grav, UA: 1.02
pH, UA: 6

## 2016-09-01 NOTE — Patient Instructions (Addendum)
DASH Eating Plan DASH stands for "Dietary Approaches to Stop Hypertension." The DASH eating plan is a healthy eating plan that has been shown to reduce high blood pressure (hypertension). Additional health benefits may include reducing the risk of type 2 diabetes mellitus, heart disease, and stroke. The DASH eating plan may also help with weight loss. WHAT DO I NEED TO KNOW ABOUT THE DASH EATING PLAN? For the DASH eating plan, you will follow these general guidelines:  Choose foods with a percent daily value for sodium of less than 5% (as listed on the food label).  Use salt-free seasonings or herbs instead of table salt or sea salt.  Check with your health care provider or pharmacist before using salt substitutes.  Eat lower-sodium products, often labeled as "lower sodium" or "no salt added."  Eat fresh foods.  Eat more vegetables, fruits, and low-fat dairy products.  Choose whole grains. Look for the word "whole" as the first word in the ingredient list.  Choose fish and skinless chicken or turkey more often than red meat. Limit fish, poultry, and meat to 6 oz (170 g) each day.  Limit sweets, desserts, sugars, and sugary drinks.  Choose heart-healthy fats.  Limit cheese to 1 oz (28 g) per day.  Eat more home-cooked food and less restaurant, buffet, and fast food.  Limit fried foods.  Cook foods using methods other than frying.  Limit canned vegetables. If you do use them, rinse them well to decrease the sodium.  When eating at a restaurant, ask that your food be prepared with less salt, or no salt if possible. WHAT FOODS CAN I EAT? Seek help from a dietitian for individual calorie needs. Grains Whole grain or whole wheat bread. Brown rice. Whole grain or whole wheat pasta. Quinoa, bulgur, and whole grain cereals. Low-sodium cereals. Corn or whole wheat flour tortillas. Whole grain cornbread. Whole grain crackers. Low-sodium crackers. Vegetables Fresh or frozen vegetables  (raw, steamed, roasted, or grilled). Low-sodium or reduced-sodium tomato and vegetable juices. Low-sodium or reduced-sodium tomato sauce and paste. Low-sodium or reduced-sodium canned vegetables.  Fruits All fresh, canned (in natural juice), or frozen fruits. Meat and Other Protein Products Ground beef (85% or leaner), grass-fed beef, or beef trimmed of fat. Skinless chicken or turkey. Ground chicken or turkey. Pork trimmed of fat. All fish and seafood. Eggs. Dried beans, peas, or lentils. Unsalted nuts and seeds. Unsalted canned beans. Dairy Low-fat dairy products, such as skim or 1% milk, 2% or reduced-fat cheeses, low-fat ricotta or cottage cheese, or plain low-fat yogurt. Low-sodium or reduced-sodium cheeses. Fats and Oils Tub margarines without trans fats. Light or reduced-fat mayonnaise and salad dressings (reduced sodium). Avocado. Safflower, olive, or canola oils. Natural peanut or almond butter. Other Unsalted popcorn and pretzels. The items listed above may not be a complete list of recommended foods or beverages. Contact your dietitian for more options. WHAT FOODS ARE NOT RECOMMENDED? Grains White bread. White pasta. White rice. Refined cornbread. Bagels and croissants. Crackers that contain trans fat. Vegetables Creamed or fried vegetables. Vegetables in a cheese sauce. Regular canned vegetables. Regular canned tomato sauce and paste. Regular tomato and vegetable juices. Fruits Dried fruits. Canned fruit in light or heavy syrup. Fruit juice. Meat and Other Protein Products Fatty cuts of meat. Ribs, chicken wings, bacon, sausage, bologna, salami, chitterlings, fatback, hot dogs, bratwurst, and packaged luncheon meats. Salted nuts and seeds. Canned beans with salt. Dairy Whole or 2% milk, cream, half-and-half, and cream cheese. Whole-fat or sweetened yogurt. Full-fat   cheeses or blue cheese. Nondairy creamers and whipped toppings. Processed cheese, cheese spreads, or cheese  curds. Condiments Onion and garlic salt, seasoned salt, table salt, and sea salt. Canned and packaged gravies. Worcestershire sauce. Tartar sauce. Barbecue sauce. Teriyaki sauce. Soy sauce, including reduced sodium. Steak sauce. Fish sauce. Oyster sauce. Cocktail sauce. Horseradish. Ketchup and mustard. Meat flavorings and tenderizers. Bouillon cubes. Hot sauce. Tabasco sauce. Marinades. Taco seasonings. Relishes. Fats and Oils Butter, stick margarine, lard, shortening, ghee, and bacon fat. Coconut, palm kernel, or palm oils. Regular salad dressings. Other Pickles and olives. Salted popcorn and pretzels. The items listed above may not be a complete list of foods and beverages to avoid. Contact your dietitian for more information. WHERE CAN I FIND MORE INFORMATION? National Heart, Lung, and Blood Institute: www.nhlbi.nih.gov/health/health-topics/topics/dash/   This information is not intended to replace advice given to you by your health care provider. Make sure you discuss any questions you have with your health care provider.   Document Released: 12/02/2011 Document Revised: 01/03/2015 Document Reviewed: 10/17/2013 Elsevier Interactive Patient Education 2016 Elsevier Inc. Steps to Quit Smoking  Smoking tobacco can be harmful to your health and can affect almost every organ in your body. Smoking puts you, and those around you, at risk for developing many serious chronic diseases. Quitting smoking is difficult, but it is one of the best things that you can do for your health. It is never too late to quit. WHAT ARE THE BENEFITS OF QUITTING SMOKING? When you quit smoking, you lower your risk of developing serious diseases and conditions, such as:  Lung cancer or lung disease, such as COPD.  Heart disease.  Stroke.  Heart attack.  Infertility.  Osteoporosis and bone fractures. Additionally, symptoms such as coughing, wheezing, and shortness of breath may get better when you quit. You may  also find that you get sick less often because your body is stronger at fighting off colds and infections. If you are pregnant, quitting smoking can help to reduce your chances of having a baby of low birth weight. HOW DO I GET READY TO QUIT? When you decide to quit smoking, create a plan to make sure that you are successful. Before you quit:  Pick a date to quit. Set a date within the next two weeks to give you time to prepare.  Write down the reasons why you are quitting. Keep this list in places where you will see it often, such as on your bathroom mirror or in your car or wallet.  Identify the people, places, things, and activities that make you want to smoke (triggers) and avoid them. Make sure to take these actions:  Throw away all cigarettes at home, at work, and in your car.  Throw away smoking accessories, such as ashtrays and lighters.  Clean your car and make sure to empty the ashtray.  Clean your home, including curtains and carpets.  Tell your family, friends, and coworkers that you are quitting. Support from your loved ones can make quitting easier.  Talk with your health care provider about your options for quitting smoking.  Find out what treatment options are covered by your health insurance. WHAT STRATEGIES CAN I USE TO QUIT SMOKING?  Talk with your healthcare provider about different strategies to quit smoking. Some strategies include:  Quitting smoking altogether instead of gradually lessening how much you smoke over a period of time. Research shows that quitting "cold turkey" is more successful than gradually quitting.  Attending in-person counseling to help   help you build problem-solving skills. You are more likely to have success in quitting if you attend several counseling sessions. Even short sessions of 10 minutes can be effective.  Finding resources and support systems that can help you to quit smoking and remain smoke-free after you quit. These resources are  most helpful when you use them often. They can include:  Online chats with a Veterinary surgeoncounselor.  Telephone quitlines.  Printed Materials engineerself-help materials.  Support groups or group counseling.  Text messaging programs.  Mobile phone applications.  Taking medicines to help you quit smoking. (If you are pregnant or breastfeeding, talk with your health care provider first.) Some medicines contain nicotine and some do not. Both types of medicines help with cravings, but the medicines that include nicotine help to relieve withdrawal symptoms. Your health care provider may recommend:  Nicotine patches, gum, or lozenges.  Nicotine inhalers or sprays.  Non-nicotine medicine that is taken by mouth. Talk with your health care provider about combining strategies, such as taking medicines while you are also receiving in-person counseling. Using these two strategies together makes you more likely to succeed in quitting than if you used either strategy on its own. If you are pregnant or breastfeeding, talk with your health care provider about finding counseling or other support strategies to quit smoking. Do not take medicine to help you quit smoking unless told to do so by your health care provider. WHAT THINGS CAN I DO TO MAKE IT EASIER TO QUIT? Quitting smoking might feel overwhelming at first, but there is a lot that you can do to make it easier. Take these important actions:  Reach out to your family and friends and ask that they support and encourage you during this time. Call telephone quitlines, reach out to support groups, or work with a counselor for support.  Ask people who smoke to avoid smoking around you.  Avoid places that trigger you to smoke, such as bars, parties, or smoke-break areas at work.  Spend time around people who do not smoke.  Lessen stress in your life, because stress can be a smoking trigger for some people. To lessen stress, try:  Exercising regularly.  Deep-breathing  exercises.  Yoga.  Meditating.  Performing a body scan. This involves closing your eyes, scanning your body from head to toe, and noticing which parts of your body are particularly tense. Purposefully relax the muscles in those areas.  Download or purchase mobile phone or tablet apps (applications) that can help you stick to your quit plan by providing reminders, tips, and encouragement. There are many free apps, such as QuitGuide from the [employer] developer(Centers for Disease Control and Prevention). You can find other support for quitting smoking (smoking cessation) through smokefree.gov and other websites. HOW WILL I FEEL WHEN I QUIT SMOKING? Within the first 24 hours of quitting smoking, you may start to feel some withdrawal symptoms. These symptoms are usually most noticeable 2-3 days after quitting, but they usually do not last beyond 2-3 weeks. Changes or symptoms that you might experience include:  Mood swings.  Restlessness, anxiety, or irritation.  Difficulty concentrating.  Dizziness.  Strong cravings for sugary foods in addition to nicotine.  Mild weight gain.  Constipation.  Nausea.  Coughing or a sore throat.  Changes in how your medicines work in your body.  A depressed mood.  Difficulty sleeping (insomnia). After the first 2-3 weeks of quitting, you may start to notice more positive results, such as:  Improved sense of smell and  Decreased coughing and sore throat.  Slower heart rate.  Lower blood pressure.  Clearer skin.  The ability to breathe more easily.  Fewer sick days. Quitting smoking is very challenging for most people. Do not get discouraged if you are not successful the first time. Some people need to make many attempts to quit before they achieve long-term success. Do your best to stick to your quit plan, and talk with your health care provider if you have any questions or concerns.   This information is not intended to replace advice given to  you by your health care provider. Make sure you discuss any questions you have with your health care provider.   Document Released: 12/07/2001 Document Revised: 04/29/2015 Document Reviewed: 04/29/2015 Elsevier Interactive Patient Education 2016 Elsevier Inc.  

## 2016-09-01 NOTE — Progress Notes (Signed)
   Subjective:    Patient ID: Garrett Lee, male    DOB: 1996/07/23, 20 y.o.   MRN: [id number]  HPI  Patient arrives with c/o elevated blood pressure. 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 Patient denies any chest tightness pressure pain shortness breath nausea vomiting diarrhea states he tries the healthy but a lot of times doesn't he relates he's under a fair amount of stress but denies being anxious nervous or depressed. Does not drink he does smoke a few cigarettes a day 5 encouraged him to quit smoking. Review of Systems  Constitutional: Negative for activity change, fatigue and fever.  Respiratory: Negative for cough and shortness of breath.   Cardiovascular: Negative for chest pain and leg swelling.  Neurological: Negative for headaches.       Objective:   Physical Exam  Constitutional: He appears well-nourished. No distress.  Cardiovascular: Normal rate, regular rhythm and normal heart sounds.   No murmur heard. Pulmonary/Chest: Effort normal and breath sounds normal. No respiratory distress.  Musculoskeletal: He exhibits no edema.  Lymphadenopathy:    He has no cervical adenopathy.  Neurological: He is alert.  Psychiatric: His behavior is normal.  Vitals reviewed.         Assessment & Plan:  Blood pressure borderline Patient encouraged to lose weight exercise watch diet LC eating recommended Lab work recommended Urinalysis negative for any protein or hematuria Follow-up in approximately 3 months to see how blood pressure is doing

## 2016-12-07 ENCOUNTER — Ambulatory Visit: Payer: 59 | Admitting: Family Medicine

## 2016-12-21 ENCOUNTER — Encounter: Payer: Self-pay | Admitting: Family Medicine

## 2016-12-21 ENCOUNTER — Ambulatory Visit (INDEPENDENT_AMBULATORY_CARE_PROVIDER_SITE_OTHER): Payer: 59 | Admitting: Family Medicine

## 2016-12-21 VITALS — BP 136/84 | Temp 98.0°F | Ht 71.0 in | Wt 240.2 lb

## 2016-12-21 DIAGNOSIS — L03319 Cellulitis of trunk, unspecified: Secondary | ICD-10-CM | POA: Diagnosis not present

## 2016-12-21 DIAGNOSIS — L02219 Cutaneous abscess of trunk, unspecified: Secondary | ICD-10-CM

## 2016-12-21 MED ORDER — DOXYCYCLINE HYCLATE 100 MG PO TABS: 100.0000 mg | ORAL_TABLET | Freq: Two times a day (BID) | ORAL | 0 refills | Status: DC

## 2016-12-21 NOTE — Progress Notes (Signed)
   Subjective:    Patient ID: Garrett Lee, male    DOB: 20-May-1996, 20 y.o.   MRN: [id number]  Rash  This is a new problem. The current episode started 1 to 4 weeks ago. The problem is unchanged. The affected locations include the groin. The rash is characterized by pain. He was exposed to nothing. Treatments tried: otc ointment. The treatment provided no relief.   Patient states that he has no other concerns at this time.   Positive history of skin infections in the past.  No high fever or chills.  Recalls no injury. Review of Systems  Skin: Positive for rash.  No vomiting or diarrhea     Objective:   Physical Exam Alert vitals stable, NAD. Blood pressure good on repeat. HEENT normal. Lungs clear. Heart regular rate and rhythm. Suprapubic region erythematous patches 6 was exquisitely tender to palpation no obvious fluctuance       Assessment & Plan:  Impression skin structure infection plan doxycycline twice a day 10 days local measures discussed warning signs discussed, no cellulitis present

## 2017-01-25 ENCOUNTER — Encounter: Payer: Self-pay | Admitting: Family Medicine

## 2017-07-26 ENCOUNTER — Encounter: Payer: Self-pay | Admitting: Family Medicine

## 2018-02-23 ENCOUNTER — Other Ambulatory Visit: Payer: Self-pay | Admitting: *Deleted

## 2018-02-23 ENCOUNTER — Telehealth: Payer: Self-pay | Admitting: Family Medicine

## 2018-02-23 MED ORDER — OSELTAMIVIR PHOSPHATE 75 MG PO CAPS: 75.0000 mg | ORAL_CAPSULE | Freq: Two times a day (BID) | ORAL | 0 refills | Status: DC

## 2018-02-23 NOTE — Telephone Encounter (Signed)
Patient daughter Garrett Lee was seen this morning with the flu and now father states he has a fever and throwing up and wanting Tamilflu called into [name]

## 2018-02-23 NOTE — Telephone Encounter (Signed)
Discussed with pt. And med sent to pharm.  

## 2018-02-23 NOTE — Telephone Encounter (Signed)
Tamiflu 75 mg 1 twice daily for the next 5 days follow-up if having chest congestion shortness of breath or progressive symptoms

## 2018-02-23 NOTE — Telephone Encounter (Signed)
Spoke with patient and he is not having any other symptoms. Stated his fever was 101 last night. Please advise. Thanks. (Pts daughter Garrett HabermannHope was seen this morning with the flu)

## 2018-05-09 ENCOUNTER — Ambulatory Visit: Payer: Self-pay | Admitting: Family Medicine

## 2018-05-25 ENCOUNTER — Ambulatory Visit: Payer: Managed Care, Other (non HMO) | Admitting: Nurse Practitioner

## 2018-05-25 VITALS — BP 142/86 | Wt 249.2 lb

## 2018-05-25 DIAGNOSIS — F411 Generalized anxiety disorder: Secondary | ICD-10-CM | POA: Diagnosis not present

## 2018-05-25 DIAGNOSIS — Z79899 Other long term (current) drug therapy: Secondary | ICD-10-CM

## 2018-05-25 DIAGNOSIS — Z13220 Encounter for screening for lipoid disorders: Secondary | ICD-10-CM | POA: Diagnosis not present

## 2018-05-25 DIAGNOSIS — F430 Acute stress reaction: Secondary | ICD-10-CM | POA: Diagnosis not present

## 2018-05-25 DIAGNOSIS — I10 Essential (primary) hypertension: Secondary | ICD-10-CM | POA: Diagnosis not present

## 2018-05-25 MED ORDER — LISINOPRIL 10 MG PO TABS: 10.0000 mg | ORAL_TABLET | Freq: Every day | ORAL | 0 refills | Status: DC

## 2018-05-25 MED ORDER — CLONAZEPAM 0.5 MG PO TABS: ORAL_TABLET | ORAL | 0 refills | Status: DC

## 2018-05-26 ENCOUNTER — Encounter: Payer: Self-pay | Admitting: Nurse Practitioner

## 2018-05-26 NOTE — Progress Notes (Signed)
Subjective:  Presents for c/o elevated BP. BP running about 154/92-98. Has significant increase in stress due to work and family.  Has 3 small children at home and another on the way.  His wife is not currently working, needs to get her GED and they cannot afford the cost of childcare for so many children.  Has had some increased irritability.  Is wearing a patch trying to quit smoking.  Denies suicidal or homicidal thoughts or ideation. Denies CP/ischemic type pain or SOB. No visual changes. No difficulty speaking or swallowing. No numbness or weakness of the face, arms or legs.   Objective:   BP (!) 142/86   Wt 249 lb 3.2 oz (113 kg)   BMI 34.76 kg/m  NAD.  Alert, oriented.  Calm affect.  Lungs clear.  Heart regular rate and rhythm.  Carotids no bruits or thrills.  Lower extremities no edema.  Has gained 25 pounds since September 2017.  Assessment:   Problem List Items Addressed This Visit    None    Visit Diagnoses    Essential hypertension    -  Primary   Relevant Medications   lisinopril (PRINIVIL,ZESTRIL) 10 MG tablet   Other Relevant Orders   Basic Metabolic Panel (BMET)   Anxiety as acute reaction to exceptional stress       High risk medication use       Relevant Orders   Hepatic function panel   Screening, lipid       Relevant Orders   Lipid Profile       Plan:   Meds ordered this encounter  Medications  . lisinopril (PRINIVIL,ZESTRIL) 10 MG tablet    Sig: Take 1 tablet (10 mg total) by mouth daily.    Dispense:  90 tablet    Refill:  0    Order Specific Question:   Supervising Provider    Answer:   Merlyn Albert [[id number]]  . clonazePAM (KLONOPIN) 0.5 MG tablet    Sig: Take 1/2 tab po BID prn anxiety    Dispense:  30 tablet    Refill:  0    Order Specific Question:   Supervising Provider    Answer:   Merlyn Albert [[id number]]   Start lisinopril as directed.  Reviewed potential adverse effects.  DC med and contact office if any problems.  Continue to monitor BP  outside the office and call back if it remains above 140/90.  Trial of low-dose Klonopin, use sparingly for severe anxiety.  If anxiety persists, consider daily medication.  Labs pending. Return in about 3 months (around 08/25/2018) for recheck BP.

## 2018-08-17 ENCOUNTER — Other Ambulatory Visit: Payer: Self-pay

## 2018-08-17 MED ORDER — LISINOPRIL 10 MG PO TABS: 10.0000 mg | ORAL_TABLET | Freq: Every day | ORAL | 0 refills | Status: DC

## 2018-08-24 ENCOUNTER — Ambulatory Visit: Payer: Managed Care, Other (non HMO) | Admitting: Family Medicine

## 2018-09-06 ENCOUNTER — Ambulatory Visit: Payer: Managed Care, Other (non HMO) | Admitting: Family Medicine

## 2019-02-06 ENCOUNTER — Encounter: Payer: Self-pay | Admitting: Family Medicine

## 2019-02-06 ENCOUNTER — Ambulatory Visit: Payer: 59 | Admitting: Family Medicine

## 2019-02-06 VITALS — BP 130/96 | Temp 98.0°F | Ht 71.0 in | Wt 260.1 lb

## 2019-02-06 DIAGNOSIS — L237 Allergic contact dermatitis due to plants, except food: Secondary | ICD-10-CM

## 2019-02-06 MED ORDER — PREDNISONE 20 MG PO TABS: ORAL_TABLET | ORAL | 0 refills | Status: DC

## 2019-02-06 MED ORDER — METHYLPREDNISOLONE ACETATE 40 MG/ML IJ SUSP
40.0000 mg | Freq: Once | INTRAMUSCULAR | Status: AC
  Administered 2019-02-06: 40 mg via INTRAMUSCULAR

## 2019-02-06 NOTE — Patient Instructions (Signed)
Poison Ivy Dermatitis  Poison ivy dermatitis is redness and soreness (inflammation) of the skin. It is caused by a chemical that is found on the leaves of the poison ivy plant. You may also have itching, a rash, and blisters. Symptoms often clear up in 1-2 weeks. You may get this condition by touching a poison ivy plant. You can also get it by touching something that has the chemical on it. This may include animals or objects that have come in contact with the plant. Follow these instructions at home: General instructions  Take or apply over-the-counter and prescription medicines only as told by your doctor.  If you touch poison ivy, wash your skin with soap and cold water right away.  Use hydrocortisone creams or calamine lotion as needed to help with itching.  Take oatmeal baths as needed. Use colloidal oatmeal. You can get this at a pharmacy or grocery store. Follow the instructions on the package.  Do not scratch or rub your skin.  While you have the rash, wash your clothes right after you wear them. Prevention   Know what poison ivy looks like so you can avoid it. This plant has three leaves with flowering branches on a single stem. The leaves are glossy. They have uneven edges that come to a point at the front.  If you have touched poison ivy, wash with soap and water right away. Be sure to wash under your fingernails.  When hiking or camping, wear long pants, a long-sleeved shirt, tall socks, and hiking boots. You can also use a lotion on your skin that helps to prevent contact with the chemical on the plant.  If you think that your clothes or outdoor gear came in contact with poison ivy, rinse them off with a garden hose before you bring them inside your house. Contact a doctor if:  You have open sores in the rash area.  You have more redness, swelling, or pain in the affected area.  You have redness that spreads beyond the rash area.  You have fluid, blood, or pus coming  from the affected area.  You have a fever.  You have a rash over a large area of your body.  You have a rash on your eyes, mouth, or genitals.  Your rash does not get better after a few days. Get help right away if:  Your face swells or your eyes swell shut.  You have trouble breathing.  You have trouble swallowing. This information is not intended to replace advice given to you by your health care provider. Make sure you discuss any questions you have with your health care provider. Document Released: 01/15/2011 Document Revised: 09/06/2018 Document Reviewed: 05/21/2015 Elsevier Interactive Patient Education  2019 Elsevier Inc.  

## 2019-02-06 NOTE — Progress Notes (Signed)
   Subjective:    Patient ID: Garrett Lee, male    DOB: [birth year], 23 y.o.   MRN: [id number]  HPI Patient is here today with complaints of a rash on his face and left arm. He thinks it is poison ivy. He had been cutting trees down on Friday and by Saturday the rash appeared. His face is swollen eyes are swollen. Also has small rash to left wrist. Itching. States he knows it was poison ivy, states the vines were rubbing against his face and that was the only area not covered. States his eyes were swollen shut this morning, but better now.   He has been taking Benadryl and calimine lotion on it.   Review of Systems  HENT: Negative for trouble swallowing.   Eyes: Negative for discharge and visual disturbance.  Respiratory: Negative for chest tightness, shortness of breath and wheezing.   Skin: Positive for rash.       Objective:   Physical Exam Vitals signs and nursing note reviewed.  Constitutional:      General: He is not in acute distress.    Appearance: He is not toxic-appearing.  HENT:     Head: Normocephalic and atraumatic.     Mouth/Throat:     Mouth: Mucous membranes are moist.     Pharynx: Oropharynx is clear.  Eyes:     Pupils: Pupils are equal, round, and reactive to light.  Neck:     Musculoskeletal: Neck supple.  Cardiovascular:     Rate and Rhythm: Normal rate and regular rhythm.     Heart sounds: Normal heart sounds.  Pulmonary:     Effort: Pulmonary effort is normal. No respiratory distress.     Breath sounds: Normal breath sounds.  Lymphadenopathy:     Cervical: No cervical adenopathy.  Skin:    General: Skin is warm and dry.     Findings: Rash present.     Comments: Small linear red rash to left wrist. Patch of erythema to anterior neck. Erythema and swelling to face and eyes, worse on left side.   Neurological:     Mental Status: He is alert and oriented to person, place, and time.           Assessment & Plan:  Poison ivy dermatitis  Likely  poison ivy based on patient's history. Since extensive involvement of the face recommend depo-medrol 40 mg IM in office today with a 9 day prednisone taper. Symptomatic care discussed. Warning signs discussed. F/u if symptoms worsen or fail to improve.   Dr. Lilyan Punt was consulted on this case and is in agreement with the above treatment plan.

## 2019-10-19 ENCOUNTER — Ambulatory Visit: Payer: Self-pay

## 2019-10-19 ENCOUNTER — Other Ambulatory Visit: Payer: Self-pay

## 2019-10-19 ENCOUNTER — Ambulatory Visit
Admission: EM | Admit: 2019-10-19 | Discharge: 2019-10-19 | Disposition: A | Discharge status: Home / Self Care | Payer: Managed Care, Other (non HMO) | Attending: Emergency Medicine | Admitting: Emergency Medicine

## 2019-10-19 ENCOUNTER — Ambulatory Visit (INDEPENDENT_AMBULATORY_CARE_PROVIDER_SITE_OTHER): Payer: Self-pay

## 2019-10-19 DIAGNOSIS — S62664A Nondisplaced fracture of distal phalanx of right ring finger, initial encounter for closed fracture: Secondary | ICD-10-CM

## 2019-10-19 DIAGNOSIS — S62639A Displaced fracture of distal phalanx of unspecified finger, initial encounter for closed fracture: Secondary | ICD-10-CM

## 2019-10-19 DIAGNOSIS — Y990 Civilian activity done for income or pay: Secondary | ICD-10-CM

## 2019-10-19 MED ORDER — NAPROXEN 500 MG PO TABS: 500.0000 mg | ORAL_TABLET | Freq: Two times a day (BID) | ORAL | 0 refills | Status: DC

## 2019-10-19 NOTE — ED Provider Notes (Signed)
[place]   [id number] 10/19/19 Arrival Time: 77  NW:GNFAO finger work injury  SUBJECTIVE: History from: patient. Garrett Lee is a 23 y.o. male complains of right ring finger that began 2 days ago.  Finger got jammed in the "jaws of life" while extraction someone from a vehicle.  Works as a Social research officer, government.  Localizes the pain to the tip of right ring finger.  Describes the pain as intermittent and pounding in character.  Has NOT tried OTC medications without relief.  Symptoms are made worse to the touch.  Denies similar symptoms in the past. Complains of swollen, and numbness.  Denies fever, chills, erythema, ecchymosis, weakness, tingling.    ROS: As per HPI.  All other pertinent ROS negative.     History reviewed. No pertinent past medical history. Past Surgical History:  Procedure Laterality Date   FIBULA FRACTURE SURGERY     SHOULDER SURGERY     tubes in ears     No Known Allergies No current facility-administered medications on file prior to encounter.    Current Outpatient Medications on File Prior to Encounter  Medication Sig Dispense Refill   [DISCONTINUED] clonazePAM (KLONOPIN) 0.5 MG tablet Take 1/2 tab po BID prn anxiety (Patient not taking: Reported on 02/06/2019) 30 tablet 0   [DISCONTINUED] lisinopril (PRINIVIL,ZESTRIL) 10 MG tablet Take 1 tablet (10 mg total) by mouth daily. 90 tablet 0   Social History   Socioeconomic History   Marital status: Single    Spouse name: Not on file   Number of children: Not on file   Years of education: Not on file   Highest education level: Not on file  Occupational History   Not on file  Social Needs   Financial resource strain: Not on file   Food insecurity    Worry: Not on file    Inability: Not on file   Transportation needs    Medical: Not on file    Non-medical: Not on file  Tobacco Use   Smoking status: Current Every Day Smoker   Smokeless tobacco: Current User  Substance and  Sexual Activity   Alcohol use: No   Drug use: No   Sexual activity: Not on file  Lifestyle   Physical activity    Days per week: Not on file    Minutes per session: Not on file   Stress: Not on file  Relationships   Social connections    Talks on phone: Not on file    Gets together: Not on file    Attends religious service: Not on file    Active member of club or organization: Not on file    Attends meetings of clubs or organizations: Not on file    Relationship status: Not on file   Intimate partner violence    Fear of current or ex partner: Not on file    Emotionally abused: Not on file    Physically abused: Not on file    Forced sexual activity: Not on file  Other Topics Concern   Not on file  Social History Narrative   Not on file   History reviewed. No pertinent family history.  OBJECTIVE:  Vitals:   10/19/19 1712  BP: (!) 144/88  Pulse: 91  Resp: 18  Temp: 98.5 F (36.9 C)  SpO2: 95%    General appearance: ALERT; in no acute distress.  Head: NCAT Lungs: Normal respiratory effort CV: radial pulses 2+ bilaterally. Cap refill < 2 seconds Musculoskeletal: Right hand  Inspection: Superficial abrasion to third and fourth distal phalanges, dorsal aspect, healing without obvious drainage or bleeding Palpation: TTP over distal fourth phalanx ROM: LROM fourth digit Strength: 5/5 grip strength Skin: warm and dry Neurologic: Ambulates without difficulty; Sensation intact about the upper extremities Psychological: alert and cooperative; normal mood and affect  DIAGNOSTIC STUDIES:  Dg Hand Complete Right  Result Date: 10/19/2019 CLINICAL DATA:  Right fourth finger injury 2 days prior with pain EXAM: RIGHT HAND - COMPLETE 3+ VIEW COMPARISON:  None. FINDINGS: Nondisplaced non articular distal tuft fracture in the distal phalanx right fourth finger. No additional fracture. No dislocation. No suspicious focal osseous lesion. No significant arthropathy. No  radiopaque foreign body. IMPRESSION: Nondisplaced non articular distal tuft fracture in the distal phalanx right fourth finger. Electronically Signed   By: Delbert Phenix M.D.   On: 10/19/2019 17:31    X-rays positive for distal phalanx fracture of the RT fourth finger.  I have reviewed the x-rays myself and the radiologist interpretation. I am in agreement with the radiologist interpretation.     ASSESSMENT & PLAN:  1. Closed fracture of tuft of distal phalanx of finger   2. Work related injury     Meds ordered this encounter  Medications   naproxen (NAPROSYN) 500 MG tablet    Sig: Take 1 tablet (500 mg total) by mouth 2 (two) times daily.    Dispense:  30 tablet    Refill:  0    Order Specific Question:   Supervising Provider    Answer:   Eustace Moore [[id number]]   X-rays positive for fracture of the distal fourth/ ring finger Continue conservative management of rest, ice, and elevation Splint applied.  Wear 24/7 until cleared by ortho Take naproxen as needed for pain relief (may cause abdominal discomfort, ulcers, and GI bleeds avoid taking with other NSAIDs) Follow up with orthopedist for further evaluation and management Return or go to the ER if you have any new or worsening symptoms (fever, chills, chest pain, abdominal pain, increased pain, redness, swelling, worsening symptoms despite medications/ treatment, etc...)   Reviewed expectations re: course of current medical issues. Questions answered. Outlined signs and symptoms indicating need for more acute intervention. Patient verbalized understanding. After Visit Summary given.    Rennis Harding, PA-C 10/19/19 [phone]

## 2019-10-19 NOTE — ED Triage Notes (Signed)
Pt presents  With injury to to 4 th right finger, pt injure while extracting someone from car. Pt works in emergency services

## 2019-10-19 NOTE — Discharge Instructions (Signed)
X-rays positive for fracture of the distal fourth/ ring finger Continue conservative management of rest, ice, and elevation Splint applied.  Wear 24/7 until cleared by ortho Take naproxen as needed for pain relief (may cause abdominal discomfort, ulcers, and GI bleeds avoid taking with other NSAIDs) Follow up with orthopedist for further evaluation and management Return or go to the ER if you have any new or worsening symptoms (fever, chills, chest pain, abdominal pain, increased pain, redness, swelling, worsening symptoms despite medications/ treatment, etc...)

## 2019-12-19 ENCOUNTER — Ambulatory Visit (INDEPENDENT_AMBULATORY_CARE_PROVIDER_SITE_OTHER): Payer: 59 | Admitting: Family Medicine

## 2019-12-19 ENCOUNTER — Encounter: Payer: Self-pay | Admitting: Family Medicine

## 2019-12-19 ENCOUNTER — Other Ambulatory Visit: Payer: Self-pay

## 2019-12-19 VITALS — BP 128/86 | Temp 97.4°F | Ht 69.5 in | Wt 264.2 lb

## 2019-12-19 DIAGNOSIS — Z0000 Encounter for general adult medical examination without abnormal findings: Secondary | ICD-10-CM

## 2019-12-19 DIAGNOSIS — Z79899 Other long term (current) drug therapy: Secondary | ICD-10-CM

## 2019-12-19 DIAGNOSIS — Z720 Tobacco use: Secondary | ICD-10-CM | POA: Diagnosis not present

## 2019-12-19 DIAGNOSIS — L03116 Cellulitis of left lower limb: Secondary | ICD-10-CM | POA: Diagnosis not present

## 2019-12-19 DIAGNOSIS — Z13220 Encounter for screening for lipoid disorders: Secondary | ICD-10-CM

## 2019-12-19 MED ORDER — VARENICLINE TARTRATE 1 MG PO TABS: 1.0000 mg | ORAL_TABLET | Freq: Two times a day (BID) | ORAL | 2 refills | Status: DC

## 2019-12-19 MED ORDER — CHANTIX STARTING MONTH PAK 0.5 MG X 11 & 1 MG X 42 PO TABS: ORAL_TABLET | ORAL | 0 refills | Status: DC

## 2019-12-19 MED ORDER — CEPHALEXIN 500 MG PO CAPS: 500.0000 mg | ORAL_CAPSULE | Freq: Four times a day (QID) | ORAL | 0 refills | Status: DC

## 2019-12-19 NOTE — Progress Notes (Signed)
Subjective:    Patient ID: Garrett Lee, male    DOB: 1996/11/11, 23 y.o.   MRN: [id number]  HPI The patient comes in today for a wellness visit. Very nice gentleman Working hard to try to get healthier trying to eat healthier staying more active lose weight He also smokes we talked at length about the importance of quitting smoking to reduce risk of heart attack strokes and other premature death in addition to this we talked about strategies to quit and talked about cold [place] versus patches versus medications we did discuss benefits and risk with medications and also he states he tried patches before but did not have success with that   A review of their health history was completed.  A review of medications was also completed.  Any needed refills; none  Eating habits: doing good   Falls/  MVA accidents in past few months: none  Regular exercise: exercises regular   Specialist pt sees on regular basis: none  Preventative health issues were discussed.   Additional concerns:  He has an area on the left side of the leg that is inflamed and red somewhat tender but it drained yesterday.  Review of Systems  Constitutional: Negative for activity change, appetite change and fever.  HENT: Negative for congestion and rhinorrhea.   Eyes: Negative for discharge.  Respiratory: Negative for cough and wheezing.   Cardiovascular: Negative for chest pain.  Gastrointestinal: Negative for abdominal pain, blood in stool and vomiting.  Genitourinary: Negative for difficulty urinating and frequency.  Musculoskeletal: Negative for neck pain.  Skin: Negative for rash.  Allergic/Immunologic: Negative for environmental allergies and food allergies.  Neurological: Negative for weakness and headaches.  Psychiatric/Behavioral: Negative for agitation.       Objective:   Physical Exam Constitutional:      Appearance: He is well-developed.  HENT:     Head: Normocephalic and atraumatic.   Right Ear: External ear normal.     Left Ear: External ear normal.     Nose: Nose normal.  Eyes:     Pupils: Pupils are equal, round, and reactive to light.  Neck:     Thyroid: No thyromegaly.  Cardiovascular:     Rate and Rhythm: Normal rate and regular rhythm.     Heart sounds: Normal heart sounds. No murmur.  Pulmonary:     Effort: Pulmonary effort is normal. No respiratory distress.     Breath sounds: Normal breath sounds. No wheezing.  Abdominal:     General: Bowel sounds are normal. There is no distension.     Palpations: Abdomen is soft. There is no mass.     Tenderness: There is no abdominal tenderness.  Genitourinary:    Penis: Normal.   Musculoskeletal:        General: Normal range of motion.     Cervical back: Normal range of motion and neck supple.  Lymphadenopathy:     Cervical: No cervical adenopathy.  Skin:    General: Skin is warm and dry.     Findings: No erythema.  Neurological:     Mental Status: He is alert.     Motor: No abnormal muscle tone.  Psychiatric:        Behavior: Behavior normal.        Judgment: Judgment normal.     Left inner leg infection this is not fluctuant.  Lower leg infection not abscessed he will do warm compresses Keflex notify us if any ongoing troubles  Patient was counseled  regarding quitting smoking 5 to 10 minutes spent with patient discussing medications and warning signs regarding Chantix he states he would like to try Chantix he is already tried cold [place] and patches without success he was warned that if the medication causes him to feel depressed sad or suicidal to immediately stop the medicine and notify us if any problems    Assessment & Plan:  Adult wellness-complete.wellness physical was conducted today. Importance of diet and exercise were discussed in detail.  In addition to this a discussion regarding safety was also covered. We also reviewed over immunizations and gave recommendations regarding current  immunization needed for age.  In addition to this additional areas were also touched on including: Preventative health exams needed:  Colonoscopy not indicated Patient encouraged to lose weight he is trying hard to do so stays physically active Patient was advised yearly wellness exam  Smoking cessation was discussed in detail also to go ahead and try Chantix we discussed the risk and benefit of multiple things including Wellbutrin and Chantix he chose Chantix he was told if he should start feeling depressed suicidal or severe dreams with Chantix to stop the medicine right away

## 2019-12-19 NOTE — Patient Instructions (Addendum)
Steps to Quit Smoking Smoking tobacco is the leading cause of preventable death. It can affect almost every organ in the body. Smoking puts you and people around you at risk for many serious, long-lasting (chronic) diseases. Quitting smoking can be hard, but it is one of the best things that you can do for your health. It is never too late to quit. How do I get ready to quit? When you decide to quit smoking, make a plan to help you succeed. Before you quit:  Pick a date to quit. Set a date within the next 2 weeks to give you time to prepare.  Write down the reasons why you are quitting. Keep this list in places where you will see it often.  Tell your family, friends, and co-workers that you are quitting. Their support is important.  Talk with your doctor about the choices that may help you quit.  Find out if your health insurance will pay for these treatments.  Know the people, places, things, and activities that make you want to smoke (triggers). Avoid them. What first steps can I take to quit smoking?  Throw away all cigarettes at home, at work, and in your car.  Throw away the things that you use when you smoke, such as ashtrays and lighters.  Clean your car. Make sure to empty the ashtray.  Clean your home, including curtains and carpets. What can I do to help me quit smoking? Talk with your doctor about taking medicines and seeing a counselor at the same time. You are more likely to succeed when you do both.  If you are pregnant or breastfeeding, talk with your doctor about counseling or other ways to quit smoking. Do not take medicine to help you quit smoking unless your doctor tells you to do so. To quit smoking: Quit right away  Quit smoking totally, instead of slowly cutting back on how much you smoke over a period of time.  Go to counseling. You are more likely to quit if you go to counseling sessions regularly. Take medicine You may take medicines to help you quit. Some  medicines need a prescription, and some you can buy over-the-counter. Some medicines may contain a drug called nicotine to replace the nicotine in cigarettes. Medicines may:  Help you to stop having the desire to smoke (cravings).  Help to stop the problems that come when you stop smoking (withdrawal symptoms). Your doctor may ask you to use:  Nicotine patches, gum, or lozenges.  Nicotine inhalers or sprays.  Non-nicotine medicine that is taken by mouth. Find resources Find resources and other ways to help you quit smoking and remain smoke-free after you quit. These resources are most helpful when you use them often. They include:  Online chats with a Social worker.  Phone quitlines.  Printed Furniture conservator/restorer.  Support groups or group counseling.  Text messaging programs.  Mobile phone apps. Use apps on your mobile phone or tablet that can help you stick to your quit plan. There are many free apps for mobile phones and tablets as well as websites. Examples include Quit Guide from the [employer] and smokefree.gov  What things can I do to make it easier to quit?   Talk to your family and friends. Ask them to support and encourage you.  Call a phone quitline (1-800-QUIT-NOW), reach out to support groups, or work with a Social worker.  Ask people who smoke to not smoke around you.  Avoid places that make you want to smoke,  such as: ? Bars. ? Parties. ? Smoke-break areas at work.  Spend time with people who do not smoke.  Lower the stress in your life. Stress can make you want to smoke. Try these things to help your stress: ? Getting regular exercise. ? Doing deep-breathing exercises. ? Doing yoga. ? Meditating. ? Doing a body scan. To do this, close your eyes, focus on one area of your body at a time from head to toe. Notice which parts of your body are tense. Try to relax the muscles in those areas. How will I feel when I quit smoking? Day 1 to 3 weeks Within the first 24 hours,  you may start to have some problems that come from quitting tobacco. These problems are very bad 2-3 days after you quit, but they do not often last for more than 2-3 weeks. You may get these symptoms:  Mood swings.  Feeling restless, nervous, angry, or annoyed.  Trouble concentrating.  Dizziness.  Strong desire for high-sugar foods and nicotine.  Weight gain.  Trouble pooping (constipation).  Feeling like you may vomit (nausea).  Coughing or a sore throat.  Changes in how the medicines that you take for other issues work in your body.  Depression.  Trouble sleeping (insomnia). Week 3 and afterward After the first 2-3 weeks of quitting, you may start to notice more positive results, such as:  Better sense of smell and taste.  Less coughing and sore throat.  Slower heart rate.  Lower blood pressure.  Clearer skin.  Better breathing.  Fewer sick days. Quitting smoking can be hard. Do not give up if you fail the first time. Some people need to try a few times before they succeed. Do your best to stick to your quit plan, and talk with your doctor if you have any questions or concerns. Summary  Smoking tobacco is the leading cause of preventable death. Quitting smoking can be hard, but it is one of the best things that you can do for your health.  When you decide to quit smoking, make a plan to help you succeed.  Quit smoking right away, not slowly over a period of time.  When you start quitting, seek help from your doctor, family, or friends. This information is not intended to replace advice given to you by your health care provider. Make sure you discuss any questions you have with your health care provider. Document Released: 10/09/2009 Document Revised: 03/02/2019 Document Reviewed: 03/03/2019 Elsevier Patient Education  [place] DASH stands for "Dietary Approaches to Stop Hypertension." The DASH eating plan is a healthy eating plan  that has been shown to reduce high blood pressure (hypertension). It may also reduce your risk for type 2 diabetes, heart disease, and stroke. The DASH eating plan may also help with weight loss. What are tips for following this plan?  General guidelines  Avoid eating more than 2,300 mg (milligrams) of salt (sodium) a day. If you have hypertension, you may need to reduce your sodium intake to 1,500 mg a day.  Limit alcohol intake to no more than 1 drink a day for nonpregnant women and 2 drinks a day for men. One drink equals 12 oz of beer, 5 oz of wine, or 1 oz of hard liquor.  Work with your health care provider to maintain a healthy body weight or to lose weight. Ask what an ideal weight is for you.  Get at least 30 minutes of exercise that causes  your heart to beat faster (aerobic exercise) most days of the week. Activities may include walking, swimming, or biking.  Work with your health care provider or diet and nutrition specialist (dietitian) to adjust your eating plan to your individual calorie needs. Reading food labels   Check food labels for the amount of sodium per serving. Choose foods with less than 5 percent of the Daily Value of sodium. Generally, foods with less than 300 mg of sodium per serving fit into this eating plan.  To find whole grains, look for the word "whole" as the first word in the ingredient list. Shopping  Buy products labeled as "low-sodium" or "no salt added."  Buy fresh foods. Avoid canned foods and premade or frozen meals. Cooking  Avoid adding salt when cooking. Use salt-free seasonings or herbs instead of table salt or sea salt. Check with your health care provider or pharmacist before using salt substitutes.  Do not fry foods. Cook foods using healthy methods such as baking, boiling, grilling, and broiling instead.  Cook with heart-healthy oils, such as olive, canola, soybean, or sunflower oil. Meal planning  Eat a balanced diet that  includes: ? 5 or more servings of fruits and vegetables each day. At each meal, try to fill half of your plate with fruits and vegetables. ? Up to 6-8 servings of whole grains each day. ? Less than 6 oz of lean meat, poultry, or fish each day. A 3-oz serving of meat is about the same size as a deck of cards. One egg equals 1 oz. ? 2 servings of low-fat dairy each day. ? A serving of nuts, seeds, or beans 5 times each week. ? Heart-healthy fats. Healthy fats called Omega-3 fatty acids are found in foods such as flaxseeds and coldwater fish, like sardines, salmon, and mackerel.  Limit how much you eat of the following: ? Canned or prepackaged foods. ? Food that is high in trans fat, such as fried foods. ? Food that is high in saturated fat, such as fatty meat. ? Sweets, desserts, sugary drinks, and other foods with added sugar. ? Full-fat dairy products.  Do not salt foods before eating.  Try to eat at least 2 vegetarian meals each week.  Eat more home-cooked food and less restaurant, buffet, and fast food.  When eating at a restaurant, ask that your food be prepared with less salt or no salt, if possible. What foods are recommended? The items listed may not be a complete list. Talk with your dietitian about what dietary choices are best for you. Grains Whole-grain or whole-wheat bread. Whole-grain or whole-wheat pasta. Brown rice. Orpah Cobb. Bulgur. Whole-grain and low-sodium cereals. Pita bread. Low-fat, low-sodium crackers. Whole-wheat flour tortillas. Vegetables Fresh or frozen vegetables (raw, steamed, roasted, or grilled). Low-sodium or reduced-sodium tomato and vegetable juice. Low-sodium or reduced-sodium tomato sauce and tomato paste. Low-sodium or reduced-sodium canned vegetables. Fruits All fresh, dried, or frozen fruit. Canned fruit in natural juice (without added sugar). Meat and other protein foods Skinless chicken or [place]. Ground chicken or [place]. Pork with fat  trimmed off. Fish and seafood. Egg whites. Dried beans, peas, or lentils. Unsalted nuts, nut butters, and seeds. Unsalted canned beans. Lean cuts of beef with fat trimmed off. Low-sodium, lean deli meat. Dairy Low-fat (1%) or fat-free (skim) milk. Fat-free, low-fat, or reduced-fat cheeses. Nonfat, low-sodium ricotta or cottage cheese. Low-fat or nonfat yogurt. Low-fat, low-sodium cheese. Fats and oils Soft margarine without trans fats. Vegetable oil. Low-fat, reduced-fat, or light mayonnaise  and salad dressings (reduced-sodium). Canola, safflower, olive, soybean, and sunflower oils. Avocado. Seasoning and other foods Herbs. Spices. Seasoning mixes without salt. Unsalted popcorn and pretzels. Fat-free sweets. What foods are not recommended? The items listed may not be a complete list. Talk with your dietitian about what dietary choices are best for you. Grains Baked goods made with fat, such as croissants, muffins, or some breads. Dry pasta or rice meal packs. Vegetables Creamed or fried vegetables. Vegetables in a cheese sauce. Regular canned vegetables (not low-sodium or reduced-sodium). Regular canned tomato sauce and paste (not low-sodium or reduced-sodium). Regular tomato and vegetable juice (not low-sodium or reduced-sodium). Rosita FirePickles. Olives. Fruits Canned fruit in a light or heavy syrup. Fried fruit. Fruit in cream or butter sauce. Meat and other protein foods Fatty cuts of meat. Ribs. Fried meat. Tomasa BlaseBacon. Sausage. Bologna and other processed lunch meats. Salami. Fatback. Hotdogs. Bratwurst. Salted nuts and seeds. Canned beans with added salt. Canned or smoked fish. Whole eggs or egg yolks. Chicken or [place] with skin. Dairy Whole or 2% milk, cream, and half-and-half. Whole or full-fat cream cheese. Whole-fat or sweetened yogurt. Full-fat cheese. Nondairy creamers. Whipped toppings. Processed cheese and cheese spreads. Fats and oils Butter. Stick margarine. Lard. Shortening. Ghee. Bacon fat.  Tropical oils, such as coconut, palm kernel, or palm oil. Seasoning and other foods Salted popcorn and pretzels. Onion salt, garlic salt, seasoned salt, table salt, and sea salt. Worcestershire sauce. Tartar sauce. Barbecue sauce. Teriyaki sauce. Soy sauce, including reduced-sodium. Steak sauce. Canned and packaged gravies. Fish sauce. Oyster sauce. Cocktail sauce. Horseradish that you find on the shelf. Ketchup. Mustard. Meat flavorings and tenderizers. Bouillon cubes. Hot sauce and Tabasco sauce. Premade or packaged marinades. Premade or packaged taco seasonings. Relishes. Regular salad dressings. Where to find more information:  National Heart, Lung, and Blood Institute: PopSteam.iswww.nhlbi.nih.gov  American Heart Association: www.heart.org Summary  The DASH eating plan is a healthy eating plan that has been shown to reduce high blood pressure (hypertension). It may also reduce your risk for type 2 diabetes, heart disease, and stroke.  With the DASH eating plan, you should limit salt (sodium) intake to 2,300 mg a day. If you have hypertension, you may need to reduce your sodium intake to 1,500 mg a day.  When on the DASH eating plan, aim to eat more fresh fruits and vegetables, whole grains, lean proteins, low-fat dairy, and heart-healthy fats.  Work with your health care provider or diet and nutrition specialist (dietitian) to adjust your eating plan to your individual calorie needs. This information is not intended to replace advice given to you by your health care provider. Make sure you discuss any questions you have with your health care provider. Document Released: 12/02/2011 Document Revised: 11/25/2017 Document Reviewed: 12/06/2016 Elsevier Patient Education  2020 [employer]

## 2020-04-08 ENCOUNTER — Encounter: Payer: Self-pay | Admitting: Family Medicine

## 2020-04-08 ENCOUNTER — Ambulatory Visit: Payer: 59 | Admitting: Family Medicine

## 2020-04-08 ENCOUNTER — Other Ambulatory Visit: Payer: Self-pay

## 2020-04-08 VITALS — BP 120/90 | Temp 98.4°F | Wt 256.6 lb

## 2020-04-08 DIAGNOSIS — L0231 Cutaneous abscess of buttock: Secondary | ICD-10-CM | POA: Diagnosis not present

## 2020-04-08 DIAGNOSIS — L0501 Pilonidal cyst with abscess: Secondary | ICD-10-CM | POA: Diagnosis not present

## 2020-04-08 MED ORDER — AMOXICILLIN-POT CLAVULANATE 875-125 MG PO TABS: 1.0000 | ORAL_TABLET | Freq: Two times a day (BID) | ORAL | 0 refills | Status: DC

## 2020-04-08 NOTE — Progress Notes (Signed)
   Subjective:    Patient ID: Garrett Lee, male    DOB: 12/14/1996, 24 y.o.   MRN: [id number]  HPI Patient comes in today for an abscess on his bottom. Patient presents abscess on his bottom very tender it drained yesterday.  Got quite large before it drained.  Still having some pain and discomfort but denies any major setbacks.  Is never had this before. Patient states its been this way for about 2 weeks, went away and came back.   Review of Systems Denies high fever chills sweats nausea vomiting diarrhea    Objective:   Physical Exam Low back nontender upper buttock region the abscess is noted with some redness and firmness the buttocks himself look fine   Warnings discussed    Assessment & Plan:  Abscess Timeout taken Consent verbal given #11 blade under sterile condition was used Drainage of abscess without difficulty Packing placed Augmentin twice daily for 7 to 10 days If ongoing troubles notify us

## 2020-04-08 NOTE — Patient Instructions (Signed)
I recommend warm compress 15 minutes at a time several times per day to help with healing of this abscess  Please remove the packing tomorrow.  If you are having any difficulties please come by tomorrow morning we will be happy to remove the packing.  Antibiotics Augmentin-take 1 twice daily with a snack-if it causes too much upset stomach or problems notify us otherwise take the medicine for 7 days if doing well may stop it at that point  If having any ongoing troubles or problems please notify us

## 2020-04-22 ENCOUNTER — Other Ambulatory Visit: Payer: Self-pay | Admitting: *Deleted

## 2020-04-22 ENCOUNTER — Telehealth: Payer: Self-pay | Admitting: *Deleted

## 2020-04-22 MED ORDER — DOXYCYCLINE HYCLATE 100 MG PO TABS: 100.0000 mg | ORAL_TABLET | Freq: Two times a day (BID) | ORAL | 0 refills | Status: DC

## 2020-04-22 NOTE — Telephone Encounter (Signed)
I recommend warm compresses, doxycycline 100 mg twice daily for 7 days, give him an office appointment for in the morning please in the office thank you

## 2020-04-22 NOTE — Telephone Encounter (Signed)
Med sent to pharm and pt notified to start doxy and made appt for tomorrow morning to check abscess.

## 2020-04-22 NOTE — Telephone Encounter (Signed)
Pt seen a couple weeks ago for abscess. Finished augmentin. States the area has come back open and is having a lot of pain about the same as when he was seen. No fever. Wants appt for tomorrow. Ok to give or should he get seen at urgent care today.  [facility]

## 2020-04-23 ENCOUNTER — Encounter: Payer: Self-pay | Admitting: Family Medicine

## 2020-04-23 ENCOUNTER — Ambulatory Visit: Payer: 59 | Admitting: Family Medicine

## 2020-04-23 ENCOUNTER — Other Ambulatory Visit: Payer: Self-pay

## 2020-04-23 VITALS — BP 126/82 | Temp 97.9°F | Wt 258.0 lb

## 2020-04-23 DIAGNOSIS — L0231 Cutaneous abscess of buttock: Secondary | ICD-10-CM

## 2020-04-23 DIAGNOSIS — L0501 Pilonidal cyst with abscess: Secondary | ICD-10-CM | POA: Diagnosis not present

## 2020-04-23 NOTE — Patient Instructions (Signed)
We will be setting you up with [facility]. Our office will call them with your information and they should reach out to you within the next couple days to help set up your appointment hopefully for next week  Please do warm compresses 20 minutes at a time several times a day Continue the antibiotics If you have further trouble please call us thank you-Dr. Lorin Picket

## 2020-04-23 NOTE — Progress Notes (Signed)
   Subjective:    Patient ID: Garrett Lee, male    DOB: [birth year], 24 y.o.   MRN: [id number]  HPI  Patient comes in today for an abscess on buttock that was treated a few weeks ago that has since started draining again.  Patient is experiencing discomfort but did start doxycycline yesterday. Patient had a buttock abscess that may have been a mild pilonidal cyst this was drained without difficulty over a week ago but now having reoccurrence with drainage and tenderness  Review of Systems See above    Objective:   Physical Exam This area appears to drain through the previous I&D will proceed forward with antibiotics warm compresses       Assessment & Plan:  Referral to general surgery for removal of this area Continue doxycycline twice daily Continue doxycycline twice daily Warm compresses frequently follow-up if problems

## 2020-05-06 IMAGING — DX DG HAND COMPLETE 3+V*R*
3 series · 3 of 3 positions shown · non-contrast
Comparison: None.

CLINICAL DATA: Right fourth finger injury 2 days prior with pain

EXAM:
RIGHT HAND - COMPLETE 3+ VIEW

[hand pa]
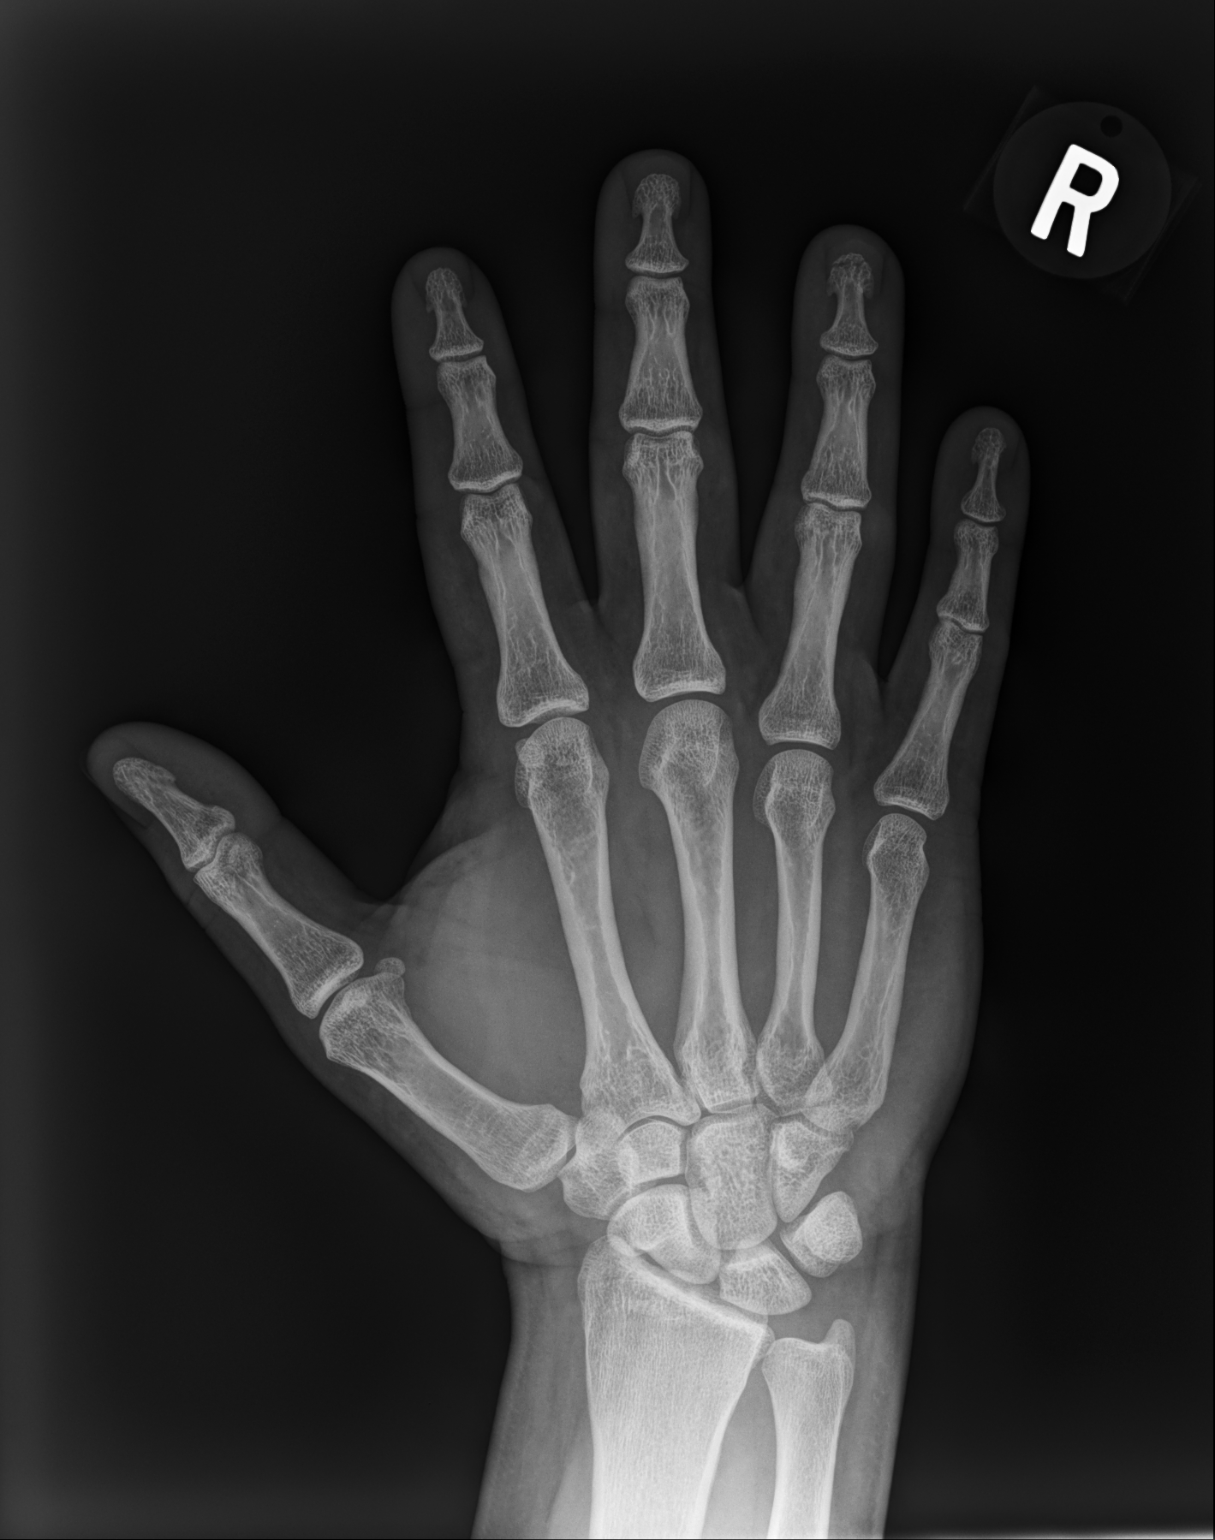

[hand mlo]
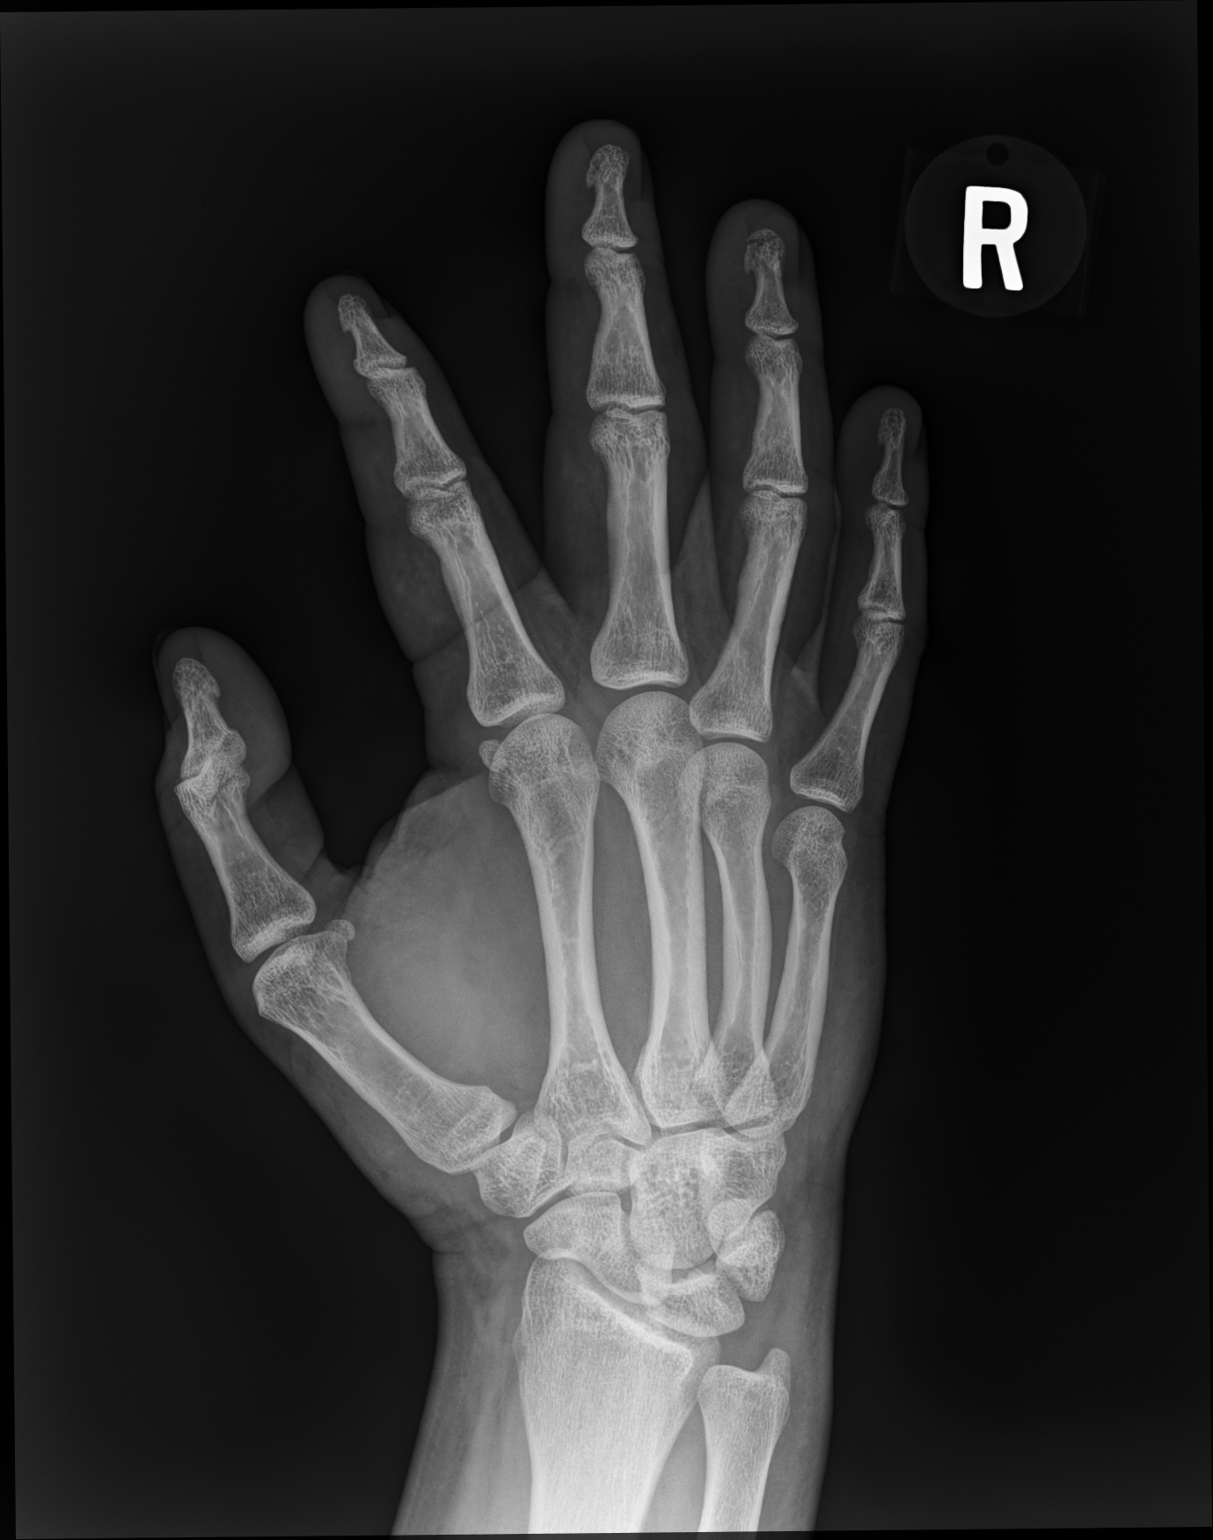

[hand lat]
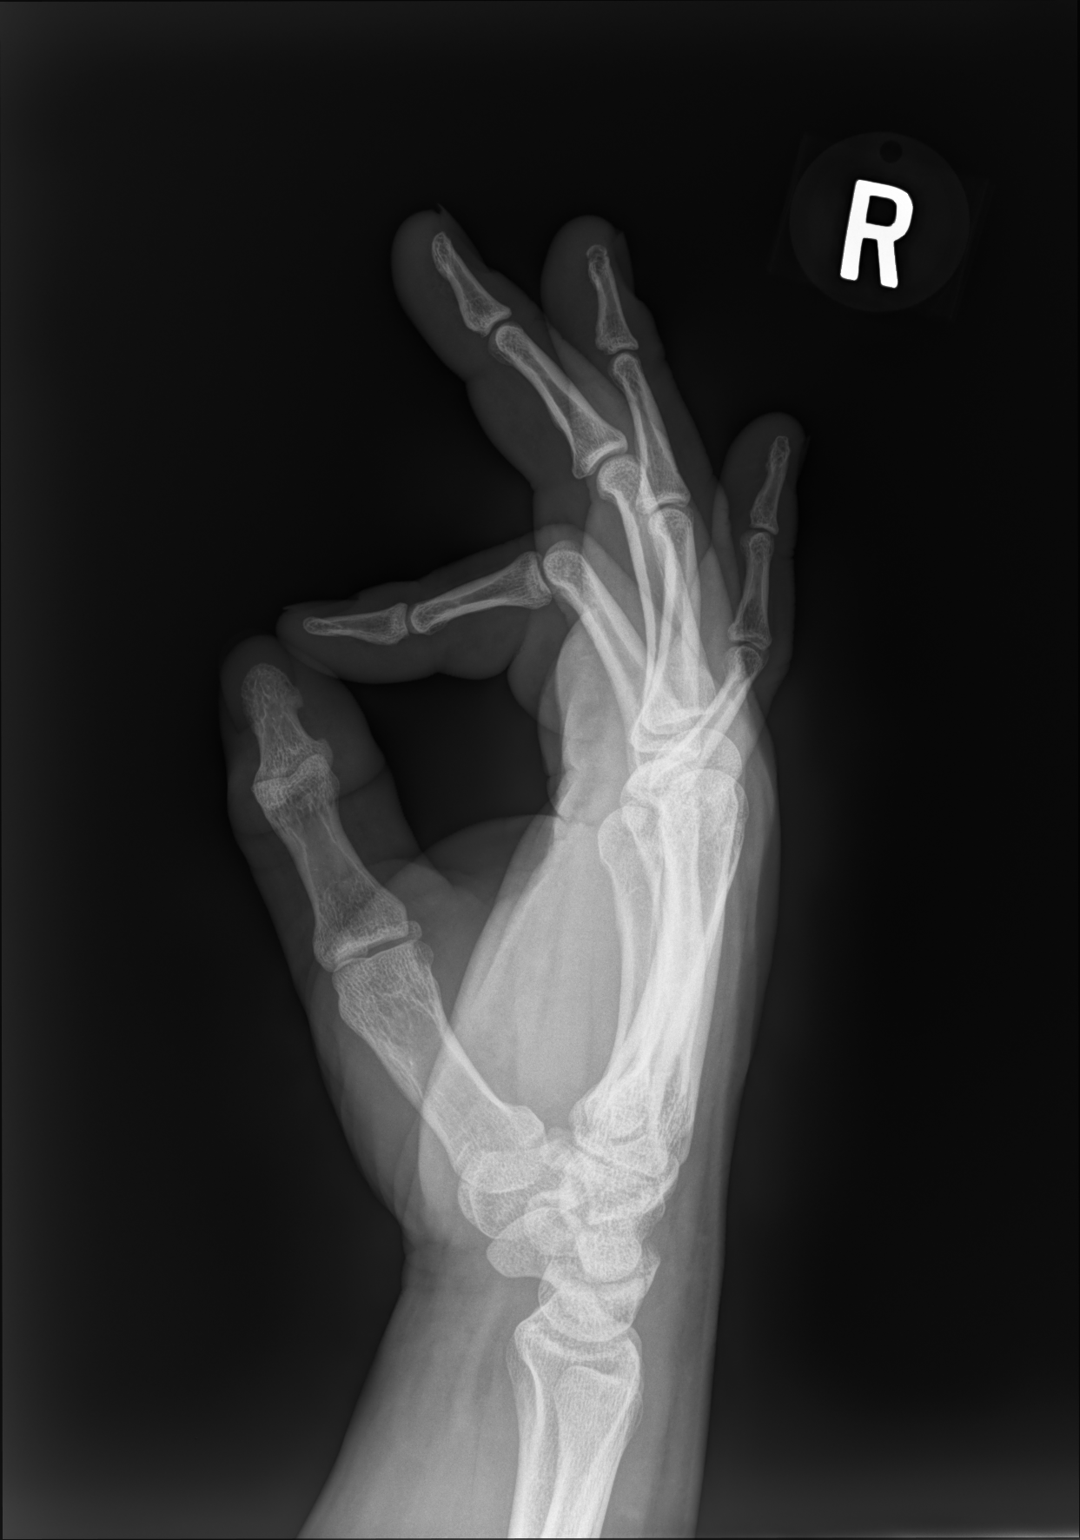

[3 of 3 positions shown; findings below may reference images not displayed]

FINDINGS: Nondisplaced non articular distal tuft fracture in the distal
phalanx right fourth finger. No additional fracture. No dislocation.
No suspicious focal osseous lesion. No significant arthropathy. No
radiopaque foreign body.
IMPRESSION: Nondisplaced non articular distal tuft fracture in the distal
phalanx right fourth finger.

## 2021-11-30 ENCOUNTER — Telehealth: Payer: Self-pay | Admitting: Family Medicine

## 2021-11-30 DIAGNOSIS — Z131 Encounter for screening for diabetes mellitus: Secondary | ICD-10-CM

## 2021-11-30 DIAGNOSIS — I10 Essential (primary) hypertension: Secondary | ICD-10-CM

## 2021-11-30 DIAGNOSIS — Z13220 Encounter for screening for lipoid disorders: Secondary | ICD-10-CM

## 2021-11-30 DIAGNOSIS — Z114 Encounter for screening for human immunodeficiency virus [HIV]: Secondary | ICD-10-CM

## 2021-11-30 DIAGNOSIS — Z1159 Encounter for screening for other viral diseases: Secondary | ICD-10-CM

## 2021-11-30 DIAGNOSIS — Z130 Encounter for screening for diseases of the blood and blood-forming organs and certain disorders involving the immune mechanism: Secondary | ICD-10-CM

## 2021-11-30 NOTE — Telephone Encounter (Signed)
Patient has physical 1/5 and needing labs

## 2021-11-30 NOTE — Telephone Encounter (Signed)
Metabolic 7, lipid, liver, hepatitis C antibody, HIV antibody CBC screening Hypertension Lipid screening Wellness

## 2021-11-30 NOTE — Telephone Encounter (Signed)
Blood work ordered in Epic. Patient notified. 

## 2021-12-31 ENCOUNTER — Encounter: Payer: 59 | Admitting: Family Medicine

## 2022-01-01 LAB — HEPATIC FUNCTION PANEL
ALT: 89 IU/L — ABNORMAL HIGH (ref 0–44)
AST: 43 IU/L — ABNORMAL HIGH (ref 0–40)
Albumin: 4.8 g/dL (ref 4.1–5.2)
Alkaline Phosphatase: 107 IU/L (ref 44–121)
Bilirubin Total: 0.5 mg/dL (ref 0.0–1.2)
Bilirubin, Direct: <0.1 mg/dL (ref 0.00–0.40)
Total Protein: 6.8 g/dL (ref 6.0–8.5)

## 2022-01-01 LAB — LIPID PANEL
Chol/HDL Ratio: 8.8 ratio — ABNORMAL HIGH (ref 0.0–5.0)
Cholesterol, Total: 247 mg/dL — ABNORMAL HIGH (ref 100–199)
HDL: 28 mg/dL — ABNORMAL LOW (ref >39)
LDL Chol Calc (NIH): 160 mg/dL — ABNORMAL HIGH (ref 0–99)
Triglycerides: 312 mg/dL — ABNORMAL HIGH (ref 0–149)
VLDL Cholesterol Cal: 59 mg/dL — ABNORMAL HIGH (ref 5–40)

## 2022-01-01 LAB — BASIC METABOLIC PANEL
BUN/Creatinine Ratio: 12 (ref 9–20)
BUN: 10 mg/dL (ref 6–20)
CO2: 25 mmol/L (ref 20–29)
Calcium: 9.7 mg/dL (ref 8.7–10.2)
Chloride: 103 mmol/L (ref 96–106)
Creatinine, Ser: 0.85 mg/dL (ref 0.76–1.27)
Glucose: 92 mg/dL (ref 70–99)
Potassium: 4.5 mmol/L (ref 3.5–5.2)
Sodium: 143 mmol/L (ref 134–144)
eGFR: 122 mL/min/{1.73_m2} (ref >59)

## 2022-01-01 LAB — CBC WITH DIFFERENTIAL/PLATELET
Basophils Absolute: 0.1 10*3/uL (ref 0.0–0.2)
Basos: 1 %
EOS (ABSOLUTE): 1.1 10*3/uL — ABNORMAL HIGH (ref 0.0–0.4)
Eos: 10 %
Hematocrit: 48.2 % (ref 37.5–51.0)
Hemoglobin: 16.7 g/dL (ref 13.0–17.7)
Immature Grans (Abs): 0 10*3/uL (ref 0.0–0.1)
Immature Granulocytes: 0 %
Lymphocytes Absolute: 4 10*3/uL — ABNORMAL HIGH (ref 0.7–3.1)
Lymphs: 34 %
MCH: 31 pg (ref 26.6–33.0)
MCHC: 34.6 g/dL (ref 31.5–35.7)
MCV: 90 fL (ref 79–97)
Monocytes Absolute: 0.8 10*3/uL (ref 0.1–0.9)
Monocytes: 7 %
Neutrophils Absolute: 5.6 10*3/uL (ref 1.4–7.0)
Neutrophils: 48 %
Platelets: 177 10*3/uL (ref 150–450)
RBC: 5.38 x10E6/uL (ref 4.14–5.80)
RDW: 13 % (ref 11.6–15.4)
WBC: 11.7 10*3/uL — ABNORMAL HIGH (ref 3.4–10.8)

## 2022-01-01 LAB — HIV ANTIBODY (ROUTINE TESTING W REFLEX): HIV Screen 4th Generation wRfx: NONREACTIVE

## 2022-01-01 LAB — HEPATITIS C ANTIBODY: Hep C Virus Ab: <0.1 s/co ratio (ref 0.0–0.9)

## 2022-01-05 ENCOUNTER — Other Ambulatory Visit: Payer: Self-pay

## 2022-01-05 ENCOUNTER — Encounter: Payer: Self-pay | Admitting: Family Medicine

## 2022-01-05 ENCOUNTER — Ambulatory Visit (INDEPENDENT_AMBULATORY_CARE_PROVIDER_SITE_OTHER): Payer: No Typology Code available for payment source | Admitting: Family Medicine

## 2022-01-05 VITALS — BP 130/88 | HR 85 | Temp 97.9°F | Ht 69.25 in | Wt 268.6 lb

## 2022-01-05 DIAGNOSIS — E785 Hyperlipidemia, unspecified: Secondary | ICD-10-CM

## 2022-01-05 DIAGNOSIS — R635 Abnormal weight gain: Secondary | ICD-10-CM

## 2022-01-05 DIAGNOSIS — R7401 Elevation of levels of liver transaminase levels: Secondary | ICD-10-CM

## 2022-01-05 DIAGNOSIS — K760 Fatty (change of) liver, not elsewhere classified: Secondary | ICD-10-CM | POA: Diagnosis not present

## 2022-01-05 DIAGNOSIS — Z0000 Encounter for general adult medical examination without abnormal findings: Secondary | ICD-10-CM

## 2022-01-05 DIAGNOSIS — Z130 Encounter for screening for diseases of the blood and blood-forming organs and certain disorders involving the immune mechanism: Secondary | ICD-10-CM

## 2022-01-05 DIAGNOSIS — I10 Essential (primary) hypertension: Secondary | ICD-10-CM

## 2022-01-05 DIAGNOSIS — Z0001 Encounter for general adult medical examination with abnormal findings: Secondary | ICD-10-CM | POA: Diagnosis not present

## 2022-01-05 DIAGNOSIS — R748 Abnormal levels of other serum enzymes: Secondary | ICD-10-CM

## 2022-01-05 DIAGNOSIS — Z13220 Encounter for screening for lipoid disorders: Secondary | ICD-10-CM

## 2022-01-05 MED ORDER — BUPROPION HCL ER (XL) 150 MG PO TB24: 150.0000 mg | ORAL_TABLET | Freq: Every day | ORAL | 3 refills | Status: DC

## 2022-01-05 NOTE — Progress Notes (Signed)
Subjective:    Patient ID: Garrett Lee, male    DOB: [birth year], 28 y.o.   MRN: [id number]  HPI The patient comes in today for a wellness visit. Patient states he is going to try to do a better job of eating healthier Patient does state he smokes he is interested in trying to quit Previously could not afford Chantix Patient states he eats on the right both for breakfast and lunch but typically suppertime eats at home Has a very busy household with 5 children plus also works full-time Stress levels are reasonable Denies being depressed Gets mild amount of rest at night Results for orders placed or performed in visit on 11/30/21  Lipid panel  Result Value Ref Range   Cholesterol, Total 247 (H) 100 - 199 mg/dL   Triglycerides 312 (H) 0 - 149 mg/dL   HDL 28 (L) >39 mg/dL   VLDL Cholesterol Cal 59 (H) 5 - 40 mg/dL   LDL Chol Calc (NIH) 160 (H) 0 - 99 mg/dL   Chol/HDL Ratio 8.8 (H) 0.0 - 5.0 ratio  Hepatic function panel  Result Value Ref Range   Total Protein 6.8 6.0 - 8.5 g/dL   Albumin 4.8 4.1 - 5.2 g/dL   Bilirubin Total 0.5 0.0 - 1.2 mg/dL   Bilirubin, Direct <0.10 0.00 - 0.40 mg/dL   Alkaline Phosphatase 107 44 - 121 IU/L   AST 43 (H) 0 - 40 IU/L   ALT 89 (H) 0 - 44 IU/L  Basic metabolic panel  Result Value Ref Range   Glucose 92 70 - 99 mg/dL   BUN 10 6 - 20 mg/dL   Creatinine, Ser 0.85 0.76 - 1.27 mg/dL   eGFR 122 >59 mL/min/1.73   BUN/Creatinine Ratio 12 9 - 20   Sodium 143 134 - 144 mmol/L   Potassium 4.5 3.5 - 5.2 mmol/L   Chloride 103 96 - 106 mmol/L   CO2 25 20 - 29 mmol/L   Calcium 9.7 8.7 - 10.2 mg/dL  HIV Antibody (routine testing w rflx)  Result Value Ref Range   HIV Screen 4th Generation wRfx Non Reactive Non Reactive  CBC with Differential/Platelet  Result Value Ref Range   WBC 11.7 (H) 3.4 - 10.8 x10E3/uL   RBC 5.38 4.14 - 5.80 x10E6/uL   Hemoglobin 16.7 13.0 - 17.7 g/dL   Hematocrit 48.2 37.5 - 51.0 %   MCV 90 79 - 97 fL   MCH 31.0 26.6 - 33.0  pg   MCHC 34.6 31.5 - 35.7 g/dL   RDW 13.0 11.6 - 15.4 %   Platelets 177 150 - 450 x10E3/uL   Neutrophils 48 Not Estab. %   Lymphs 34 Not Estab. %   Monocytes 7 Not Estab. %   Eos 10 Not Estab. %   Basos 1 Not Estab. %   Neutrophils Absolute 5.6 1.4 - 7.0 x10E3/uL   Lymphocytes Absolute 4.0 (H) 0.7 - 3.1 x10E3/uL   Monocytes Absolute 0.8 0.1 - 0.9 x10E3/uL   EOS (ABSOLUTE) 1.1 (H) 0.0 - 0.4 x10E3/uL   Basophils Absolute 0.1 0.0 - 0.2 x10E3/uL   Immature Granulocytes 0 Not Estab. %   Immature Grans (Abs) 0.0 0.0 - 0.1 x10E3/uL  Hepatitis C Antibody  Result Value Ref Range   Hep C Virus Ab <0.1 0.0 - 0.9 s/co ratio     A review of their health history was completed.  A review of medications was also completed.  Any needed refills; none  Eating habits:  fair  Falls/  MVA accidents in past few months: one fall; fell down stair  Regular exercise: yes   Specialist pt sees on regular basis: none  Preventative health issues were discussed.   Additional concerns: none    Review of Systems     Objective:   Physical Exam  General-in no acute distress Eyes-no discharge Lungs-respiratory rate normal, CTA CV-no murmurs,RRR Extremities skin warm dry no edema Neuro grossly normal Behavior normal, alert       Assessment & Plan:  1. Well adult exam Adult wellness-complete.wellness physical was conducted today. Importance of diet and exercise were discussed in detail.  In addition to this a discussion regarding safety was also covered. We also reviewed over immunizations and gave recommendations regarding current immunization needed for age.  In addition to this additional areas were also touched on including: Preventative health exams needed:  Colonoscopy not indicated  Patient was advised yearly wellness exam   2. Fatty liver This was present on a previous CAT scan in 2016 now his liver enzymes are elevated puts him in a category of potential NASH Best approach  currently would be healthy diet regular activity portion control trying to lose weight recheck this again in approximately 6 months  3. Elevated transaminase level Liver enzymes trending upward  4. Hyperlipidemia, unspecified hyperlipidemia type Moderate elevation of cholesterol very important to do the best he can at healthy eating regular physical activity may well need to be on statin as he gets older  5. Morbid obesity ([place]) Consideration for GLP-1 Healthy diet recommended Regular walking  Patient counseled to quit smoking We did discuss options including cold [place], patches, Wellbutrin, Chantix Patient chooses Wellbutrin twice daily to help with quitting smoking Patient to give [place] update in 6 weeks how it is going

## 2022-01-05 NOTE — Patient Instructions (Addendum)
Managing the Challenge of Quitting Smoking °Quitting smoking is a physical and mental challenge. You will face cravings, withdrawal symptoms, and temptation. Before quitting, work with your health care provider to make a plan that can help you manage quitting. Preparation can help you quit and keep you from giving in. °How to manage lifestyle changes °Managing stress °Stress can make you want to smoke, and wanting to smoke may cause stress. It is important to find ways to manage your stress. You might try some of the following: °Practice relaxation techniques. °Breathe slowly and deeply, in through your nose and out through your mouth. °Listen to music. °Soak in a bath or take a shower. °Imagine a peaceful place or vacation. °Get some support. °Talk with family or friends about your stress. °Join a support group. °Talk with a counselor or therapist. °Get some physical activity. °Go for a walk, run, or bike ride. °Play a favorite sport. °Practice yoga. ° °Medicines °Talk with your health care provider about medicines that might help you deal with cravings and make quitting easier for you. °Relationships °Social situations can be difficult when you are quitting smoking. To manage this, you can: °Avoid parties and other social situations where people might be smoking. °Avoid alcohol. °Leave right away if you have the urge to smoke. °Explain to your family and friends that you are quitting smoking. Ask for support and let them know you might be a bit grumpy. °Plan activities where smoking is not an option. °General instructions °Be aware that many people gain weight after they quit smoking. However, not everyone does. To keep from gaining weight, have a plan in place before you quit and stick to the plan after you quit. Your plan should include: °Having healthy snacks. When you have a craving, it may help to: °Eat popcorn, carrots, celery, or other cut vegetables. °Chew sugar-free gum. °Changing how you eat. °Eat small  portion sizes at meals. °Eat 4-6 small meals throughout the day instead of 1-2 large meals a day. °Be mindful when you eat. Do not watch television or do other things that might distract you as you eat. °Exercising regularly. °Make time to exercise each day. If you do not have time for a long workout, do short bouts of exercise for 5-10 minutes several times a day. °Do some form of strengthening exercise, such as weight lifting. °Do some exercise that gets your heart beating and causes you to breathe deeply, such as walking fast, running, swimming, or biking. This is very important. °Drinking plenty of water or other low-calorie or no-calorie drinks. Drink 6-8 glasses of water daily. ° °How to recognize withdrawal symptoms °Your body and mind may experience discomfort as you try to get used to not having nicotine in your system. These effects are called withdrawal symptoms. They may include: °Feeling hungrier than normal. °Having trouble concentrating. °Feeling irritable or restless. °Having trouble sleeping. °Feeling depressed. °Craving a cigarette. °To manage withdrawal symptoms: °Avoid places, people, and activities that trigger your cravings. °Remember why you want to quit. °Get plenty of sleep. °Avoid coffee and other caffeinated drinks. These may worsen some of your symptoms. °These symptoms may surprise you. But be assured that they are normal to have when quitting smoking. °How to manage cravings °Come up with a plan for how to deal with your cravings. The plan should include the following: °A definition of the specific situation you want to deal with. °An alternative action you will take. °A clear idea for how this action   will help. °The name of someone who might help you with this. °Cravings usually last for 5-10 minutes. Consider taking the following actions to help you with your plan to deal with cravings: °Keep your mouth busy. °Chew sugar-free gum. °Suck on hard candies or a straw. °Brush your  teeth. °Keep your hands and body busy. °Change to a different activity right away. °Squeeze or play with a ball. °Do an activity or a hobby, such as making bead jewelry, practicing needlepoint, or working with wood. °Mix up your normal routine. °Take a short exercise break. Go for a quick walk or run up and down stairs. °Focus on doing something kind or helpful for someone else. °Call a friend or family member to talk during a craving. °Join a support group. °Contact a quitline. °Where to find support °To get help or find a support group: °Call the National Cancer Institute's Smoking Quitline: 1-800-QUIT NOW (784-8669) °Visit the website of the Substance Abuse and Mental Health Services Administration: www.samhsa.gov °Text QUIT to SmokefreeTXT: 478848 °Where to find more information °Visit these websites to find more information on quitting smoking: °National Cancer Institute: www.smokefree.gov °American Lung Association: www.lung.org °American Cancer Society: www.cancer.org °Centers for Disease Control and Prevention: www.cdc.gov °American Heart Association: www.heart.org °Contact a health care provider if: °You want to change your plan for quitting. °The medicines you are taking are not helping. °Your eating feels out of control or you cannot sleep. °Get help right away if: °You feel depressed or become very anxious. °Summary °Quitting smoking is a physical and mental challenge. You will face cravings, withdrawal symptoms, and temptation to smoke again. Preparation can help you as you go through these challenges. °Try different techniques to manage stress, handle social situations, and prevent weight gain. °You can deal with cravings by keeping your mouth busy (such as by chewing gum), keeping your hands and body busy, calling family or friends, or contacting a quitline for people who want to quit smoking. °You can deal with withdrawal symptoms by avoiding places where people smoke, getting plenty of rest, and  avoiding drinks with caffeine. °This information is not intended to replace advice given to you by your health care provider. Make sure you discuss any questions you have with your health care provider. °Document Revised: 08/21/2021 Document Reviewed: 10/02/2019 °Elsevier Patient Education © 2022 [employer] Inc. °High Cholesterol °High cholesterol is a condition in which the blood has high levels of a white, waxy substance similar to fat (cholesterol). The liver makes all the cholesterol that the body needs. The human body needs small amounts of cholesterol to help build cells. A person gets extra or excess cholesterol from the food that he or she eats. °The blood carries cholesterol from the liver to the rest of the body. If you have high cholesterol, deposits (plaques) may build up on the walls of your arteries. Arteries are the blood vessels that carry blood away from your heart. These plaques make the arteries narrow and stiff. °Cholesterol plaques increase your risk for heart attack and stroke. Work with your health care provider to keep your cholesterol levels in a healthy range. °What increases the risk? °The following factors may make you more likely to develop this condition: °Eating foods that are high in animal fat (saturated fat) or cholesterol. °Being overweight. °Not getting enough exercise. °A family history of high cholesterol (familial hypercholesterolemia). °Use of tobacco products. °Having diabetes. °What are the signs or symptoms? °In most cases, high cholesterol does not usually cause any symptoms. °  In severe cases, very high cholesterol levels can cause: °Fatty bumps under the skin (xanthomas). °A white or gray ring around the black center (pupil) of the eye. °How is this diagnosed? °This condition may be diagnosed based on the results of a blood test. °If you are older than 28 years of age, your health care provider may check your cholesterol levels every 4-6 years. °You may be checked more  often if you have high cholesterol or other risk factors for heart disease. °The blood test for cholesterol measures: °"Bad" cholesterol, or LDL cholesterol. This is the main type of cholesterol that causes heart disease. The desired level is less than 100 mg/dL (2.59 mmol/L). °"Good" cholesterol, or HDL cholesterol. HDL helps protect against heart disease by cleaning the arteries and carrying the LDL to the liver for processing. The desired level for HDL is 60 mg/dL (1.55 mmol/L) or higher. °Triglycerides. These are fats that your body can store or burn for energy. The desired level is less than 150 mg/dL (1.69 mmol/L). °Total cholesterol. This measures the total amount of cholesterol in your blood and includes LDL, HDL, and triglycerides. The desired level is less than 200 mg/dL (5.17 mmol/L). °How is this treated? °Treatment for high cholesterol starts with lifestyle changes, such as diet and exercise. °Diet changes. You may be asked to eat foods that have more fiber and less saturated fats or added sugar. °Lifestyle changes. These may include regular exercise, maintaining a healthy weight, and quitting use of tobacco products. °Medicines. These are given when diet and lifestyle changes have not worked. You may be prescribed a statin medicine to help lower your cholesterol levels. °Follow these instructions at home: °Eating and drinking ° °Eat a healthy, balanced diet. This diet includes: °Daily servings of a variety of fresh, frozen, or canned fruits and vegetables. °Daily servings of whole grain foods that are rich in fiber. °Foods that are low in saturated fats and trans fats. These include poultry and fish without skin, lean cuts of meat, and low-fat dairy products. °A variety of fish, especially oily fish that contain omega-3 fatty acids. Aim to eat fish at least 2 times a week. °Avoid foods and drinks that have added sugar. °Use healthy cooking methods, such as roasting, grilling, broiling, baking,  poaching, steaming, and stir-frying. Do not fry your food except for stir-frying. °If you drink alcohol: °Limit how much you have to: °0-1 drink a day for women who are not pregnant. °0-2 drinks a day for men. °Know how much alcohol is in a drink. In the [place] one drink equals one 12 oz bottle of beer (355 mL), one 5 oz glass of wine (148 mL), or one 1½ oz glass of hard liquor (44 mL). °Lifestyle ° °Get regular exercise. Aim to exercise for a total of 150 minutes a week. Increase your activity level by doing activities such as gardening, walking, and taking the stairs. °Do not use any products that contain nicotine or tobacco. These products include cigarettes, chewing tobacco, and vaping devices, such as e-cigarettes. If you need help quitting, ask your health care provider. °General instructions °Take over-the-counter and prescription medicines only as told by your health care provider. °Keep all follow-up visits. This is important. °Where to find more information °American Heart Association: www.heart.org °National Heart, Lung, and Blood Institute: www.nhlbi.nih.gov °Contact a health care provider if: °You have trouble achieving or maintaining a healthy diet or weight. °You are starting an exercise program. °You are unable to stop smoking. °Get help   right away if: °You have chest pain. °You have trouble breathing. °You have discomfort or pain in your jaw, neck, back, shoulder, or arm. °You have any symptoms of a stroke. "BE FAST" is an easy way to remember the main warning signs of a stroke: °B - Balance. Signs are dizziness, sudden trouble walking, or loss of balance. °E - Eyes. Signs are trouble seeing or a sudden change in vision. °F - Face. Signs are sudden weakness or numbness of the face, or the face or eyelid drooping on one side. °A - Arms. Signs are weakness or numbness in an arm. This happens suddenly and usually on one side of the body. °S - Speech. Signs are sudden trouble speaking, slurred speech, or  trouble understanding what people say. °T - Time. Time to call emergency services. Write down what time symptoms started. °You have other signs of a stroke, such as: °A sudden, severe headache with no known cause. °Nausea or vomiting. °Seizure. °These symptoms may represent a serious problem that is an emergency. Do not wait to see if the symptoms will go away. Get medical help right away. Call your local emergency services (911 in the [place] Do not drive yourself to the hospital. °Summary °Cholesterol plaques increase your risk for heart attack and stroke. Work with your health care provider to keep your cholesterol levels in a healthy range. °Eat a healthy, balanced diet, get regular exercise, and maintain a healthy weight. °Do not use any products that contain nicotine or tobacco. These products include cigarettes, chewing tobacco, and vaping devices, such as e-cigarettes. °Get help right away if you have any symptoms of a stroke. °This information is not intended to replace advice given to you by your health care provider. Make sure you discuss any questions you have with your health care provider. °Document Revised: 02/26/2021 Document Reviewed: 02/16/2021 °Elsevier Patient Education © 2022 [employer] Inc. ° °

## 2022-01-05 NOTE — Progress Notes (Signed)
Labs ordered and mailed to patient.  

## 2022-07-02 ENCOUNTER — Telehealth: Payer: Self-pay

## 2022-07-02 DIAGNOSIS — Z79899 Other long term (current) drug therapy: Secondary | ICD-10-CM

## 2022-07-02 DIAGNOSIS — Z1159 Encounter for screening for other viral diseases: Secondary | ICD-10-CM

## 2022-07-02 DIAGNOSIS — K760 Fatty (change of) liver, not elsewhere classified: Secondary | ICD-10-CM

## 2022-07-02 DIAGNOSIS — R748 Abnormal levels of other serum enzymes: Secondary | ICD-10-CM

## 2022-07-02 DIAGNOSIS — E785 Hyperlipidemia, unspecified: Secondary | ICD-10-CM

## 2022-07-02 DIAGNOSIS — R635 Abnormal weight gain: Secondary | ICD-10-CM

## 2022-07-02 NOTE — Telephone Encounter (Signed)
Caller name:Salil Gail   On DPR? :No  Call back number:[phone]  Provider they see: [facility]   Reason for call:Blood work was ordered Jan 10 th and expired pt needs blood work reordered so he can go have it done before Monday Appt

## 2022-07-02 NOTE — Telephone Encounter (Signed)
Labs reordered and pt is aware

## 2022-07-03 LAB — HEPATIC FUNCTION PANEL
ALT: 119 IU/L — ABNORMAL HIGH (ref 0–44)
AST: 73 IU/L — ABNORMAL HIGH (ref 0–40)
Albumin: 4.8 g/dL (ref 4.1–5.2)
Alkaline Phosphatase: 129 IU/L — ABNORMAL HIGH (ref 44–121)
Bilirubin Total: 0.5 mg/dL (ref 0.0–1.2)
Bilirubin, Direct: 0.11 mg/dL (ref 0.00–0.40)
Total Protein: 7.3 g/dL (ref 6.0–8.5)

## 2022-07-03 LAB — TSH+FREE T4
Free T4: 1.15 ng/dL (ref 0.82–1.77)
TSH: 2.08 u[IU]/mL (ref 0.450–4.500)

## 2022-07-03 LAB — LIPID PANEL
Chol/HDL Ratio: 8.7 ratio — ABNORMAL HIGH (ref 0.0–5.0)
Cholesterol, Total: 270 mg/dL — ABNORMAL HIGH (ref 100–199)
HDL: 31 mg/dL — ABNORMAL LOW (ref >39)
LDL Chol Calc (NIH): 155 mg/dL — ABNORMAL HIGH (ref 0–99)
Triglycerides: 441 mg/dL — ABNORMAL HIGH (ref 0–149)
VLDL Cholesterol Cal: 84 mg/dL — ABNORMAL HIGH (ref 5–40)

## 2022-07-03 LAB — FERRITIN: Ferritin: 894 ng/mL — ABNORMAL HIGH (ref 30–400)

## 2022-07-03 LAB — HEPATITIS B SURFACE ANTIGEN: Hepatitis B Surface Ag: NEGATIVE

## 2022-07-05 ENCOUNTER — Encounter: Payer: Self-pay | Admitting: Family Medicine

## 2022-07-05 ENCOUNTER — Other Ambulatory Visit: Payer: Self-pay

## 2022-07-05 ENCOUNTER — Ambulatory Visit: Payer: No Typology Code available for payment source | Admitting: Family Medicine

## 2022-07-05 VITALS — BP 144/84 | HR 86 | Temp 98.5°F | Ht 69.25 in | Wt 283.6 lb

## 2022-07-05 DIAGNOSIS — R739 Hyperglycemia, unspecified: Secondary | ICD-10-CM

## 2022-07-05 DIAGNOSIS — F17200 Nicotine dependence, unspecified, uncomplicated: Secondary | ICD-10-CM

## 2022-07-05 DIAGNOSIS — E1169 Type 2 diabetes mellitus with other specified complication: Secondary | ICD-10-CM | POA: Insufficient documentation

## 2022-07-05 DIAGNOSIS — K760 Fatty (change of) liver, not elsewhere classified: Secondary | ICD-10-CM | POA: Insufficient documentation

## 2022-07-05 DIAGNOSIS — R635 Abnormal weight gain: Secondary | ICD-10-CM | POA: Diagnosis not present

## 2022-07-05 DIAGNOSIS — R7989 Other specified abnormal findings of blood chemistry: Secondary | ICD-10-CM | POA: Insufficient documentation

## 2022-07-05 DIAGNOSIS — E785 Hyperlipidemia, unspecified: Secondary | ICD-10-CM | POA: Insufficient documentation

## 2022-07-05 MED ORDER — VARENICLINE TARTRATE 1 MG PO TABS: 1.0000 mg | ORAL_TABLET | Freq: Two times a day (BID) | ORAL | 2 refills | Status: DC

## 2022-07-05 MED ORDER — WEGOVY 0.25 MG/0.5ML ~~LOC~~ SOAJ: SUBCUTANEOUS | 0 refills | Status: DC

## 2022-07-05 MED ORDER — CHANTIX STARTING MONTH PAK 0.5 MG X 11 & 1 MG X 42 PO TBPK: ORAL_TABLET | ORAL | 0 refills | Status: DC

## 2022-07-05 NOTE — Progress Notes (Signed)
Subjective:    Patient ID: Garrett Lee, male    DOB: Mar 08, 1996, 26 y.o.   MRN: [id number]  HPI  Patient here for follow up on quitting smoking and fatty liver.   Hyperlipidemia, unspecified hyperlipidemia type - Plan: Iron, TIBC and Ferritin Panel, Comprehensive metabolic panel, Anti-Smooth Muscle Antibody, IGG, ANA  Weight gain - Plan: Iron, TIBC and Ferritin Panel, Comprehensive metabolic panel, Anti-Smooth Muscle Antibody, IGG, ANA  Smoker - Plan: Iron, TIBC and Ferritin Panel, Comprehensive metabolic panel, Anti-Smooth Muscle Antibody, IGG, ANA  Fatty liver - Plan: US Abdomen Limited RUQ (LIVER/GB), Iron, TIBC and Ferritin Panel, Comprehensive metabolic panel, Anti-Smooth Muscle Antibody, IGG, ANA  Elevated ferritin - Plan: Iron, TIBC and Ferritin Panel, Comprehensive metabolic panel, Anti-Smooth Muscle Antibody, IGG, ANA  Hyperglycemia - Plan: Iron, TIBC and Ferritin Panel, Comprehensive metabolic panel, Anti-Smooth Muscle Antibody, IGG, ANA  Patient states his household very busy very hectic makes it difficult for him to follow-up healthy diet 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 does not have much time to do any exercise  He hasn't quit smoking  Results for orders placed or performed in visit on 07/02/22  Ferritin  Result Value Ref Range   Ferritin 894 (H) 30 - 400 ng/mL  TSH + free T4  Result Value Ref Range   TSH 2.080 0.450 - 4.500 uIU/mL   Free T4 1.15 0.82 - 1.77 ng/dL  Hepatitis B surface antigen  Result Value Ref Range   Hepatitis B Surface Ag Negative Negative  Hepatic function panel  Result Value Ref Range   Total Protein 7.3 6.0 - 8.5 g/dL   Albumin 4.8 4.1 - 5.2 g/dL   Bilirubin Total 0.5 0.0 - 1.2 mg/dL   Bilirubin, Direct 1.75 0.00 - 0.40 mg/dL   Alkaline Phosphatase 129 (H) 44 - 121 IU/L   AST 73 (H) 0 - 40 IU/L   ALT 119 (H) 0 - 44 IU/L  Lipid panel  Result Value Ref Range   Cholesterol, Total 270 (H) 100 -  199 mg/dL   Triglycerides 102 (H) 0 - 149 mg/dL   HDL 31 (L) >58 mg/dL   VLDL Cholesterol Cal 84 (H) 5 - 40 mg/dL   LDL Chol Calc (NIH) 527 (H) 0 - 99 mg/dL   Chol/HDL Ratio 8.7 (H) 0.0 - 5.0 ratio    Review of Systems     Objective:   Physical Exam General-in no acute distress Eyes-no discharge Lungs-respiratory rate normal, CTA CV-no murmurs,RRR Extremities skin warm dry no edema Neuro grossly normal Behavior normal, alert        Assessment & Plan:  1. Hyperlipidemia, unspecified hyperlipidemia type Patient needs to work really hard on trying to eat healthier with portion control weight loss may well end up needing to be on medication in the near future - Iron, TIBC and Ferritin Panel - Comprehensive metabolic panel - Anti-Smooth Muscle Antibody, IGG - ANA  2. Weight gain Once again portion control physical activity weight loss very very important for this patient.  [facility] sent in May or may not be covered by his insurance company  We did discuss the importance of regimented healthy eating as well as fitting him exercise and activity - Iron, TIBC and Ferritin Panel - Comprehensive metabolic panel - Anti-Smooth Muscle Antibody, IGG - ANA  3. Smoker Patient counseled to quit Chantix warning signs side effects regarding suicidal ideation depression and severe dreams was discussed - Iron, TIBC and  Ferritin Panel - Comprehensive metabolic panel - Anti-Smooth Muscle Antibody, IGG - ANA  4. Fatty liver Concern for possibility hemochromatosis needs further work-up ultrasound to look for fatty liver - US Abdomen Limited RUQ (LIVER/GB) - Iron, TIBC and Ferritin Panel - Comprehensive metabolic panel - Anti-Smooth Muscle Antibody, IGG - ANA  5. Elevated ferritin We will run some additional test to make sure patient does not have hemochromatosis ferritin significantly elevated patient does not drink alcohol - Iron, TIBC and Ferritin Panel - Comprehensive metabolic  panel - Anti-Smooth Muscle Antibody, IGG - ANA  6. 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

## 2022-07-05 NOTE — Patient Instructions (Signed)
Varenicline Tablets What is this medication? VARENICLINE (var e NI kleen) helps you quit smoking. It reduces cravings for nicotine, the addictive substance found in tobacco. It is most effective when used in combination with a stop-smoking program. This medicine may be used for other purposes; ask your health care provider or pharmacist if you have questions. COMMON BRAND NAME(S): Chantix What should I tell my care team before I take this medication? They need to know if you have any of these conditions: Heart disease Frequently drink alcohol Kidney disease Mental health condition On hemodialysis Seizures History of stroke Suicidal thoughts, plans, or attempt by you or a family member An unusual or allergic reaction to varenicline, other medications, foods, dyes, or preservatives Pregnant or trying to get pregnant Breast-feeding How should I use this medication? Take this medication by mouth after eating. Take with a full glass of water. Follow the directions on the prescription label. Take your doses at regular intervals. Do not take your medication more often than directed. There are 3 ways you can use this medication to help you quit smoking; talk to your care team to decide which plan is right for you: 1) you can choose a quit date and start this medication 1 week before the quit date, or, 2) you can start taking this medication before you choose a quit date, and then pick a quit date between day 8 and 35 days of treatment, or, 3) if you are not sure that you are able or willing to quit smoking right away, start taking this medication and slowly decrease the amount you smoke as directed by your care team with the goal of being cigarette-free by week 12 of treatment. Stick to your plan; ask about support groups or other ways to help you remain cigarette-free. If you are motivated to quit smoking and did not succeed during a previous attempt with this medication for reasons other than side  effects, or if you returned to smoking after this treatment, speak with your care team about whether another course of this medication may be right for you. A special MedGuide will be given to you by the pharmacist with each prescription and refill. Be sure to read this information carefully each time. Talk to your care team about the use of this medication in children. This medication is not approved for use in children. Overdosage: If you think you have taken too much of this medicine contact a poison control center or emergency room at once. NOTE: This medicine is only for you. Do not share this medicine with others. What if I miss a dose? If you miss a dose, take it as soon as you can. If it is almost time for your next dose, take only that dose. Do not take double or extra doses. What may interact with this medication? Alcohol Insulin Other medications used to help people quit smoking Theophylline Warfarin This list may not describe all possible interactions. Give your health care provider a list of all the medicines, herbs, non-prescription drugs, or dietary supplements you use. Also tell them if you smoke, drink alcohol, or use illegal drugs. Some items may interact with your medicine. What should I watch for while using this medication? It is okay if you do not succeed at your attempt to quit and have a cigarette. You can still continue your quit attempt and keep using this medication as directed. Just throw away your cigarettes and get back to your quit plan. Talk to your care team   before using other treatments to quit smoking. Using this medication with other treatments to quit smoking may increase the risk for side effects compared to using a treatment alone. This medication may affect your coordination, reaction time, or judgment. Do not drive or operate machinery until you know how this medication affects you. Sit up or stand slowly to reduce the risk of dizzy or fainting  spells. Decrease the number of alcoholic beverages that you drink during treatment with this medication until you know if this medication affects your ability to tolerate alcohol. Some people have experienced increased drunkenness (intoxication), unusual or sometimes aggressive behavior, or no memory of things that have happened (amnesia) during treatment with this medication. You may do unusual sleep behaviors or activities you do not remember the day after taking this medication. Activities include driving, making or eating food, talking on the phone, sexual activity, or sleep walking. Stop taking this medication and call your care team right away if you find out you have done activities like this. Patients and their families should watch out for new or worsening depression or thoughts of suicide. Also watch out for sudden changes in feelings such as feeling anxious, agitated, panicky, irritable, hostile, aggressive, impulsive, severely restless, overly excited and hyperactive, or not being able to sleep. If this happens, call your care team. If you have diabetes, and you quit smoking, the effects of insulin may be increased. You may need to reduce your insulin dose. Check with your care team about how you should adjust your insulin dose. What side effects may I notice from receiving this medication? Side effects that you should report to your care team as soon as possible: Allergic reactions or angioedema--skin rash, itching or hives, swelling of the face, eyes, lips, tongue, arms, or legs, trouble swallowing or breathing Heart attack--pain or tightness in the chest, shoulders, arms, or jaw, nausea, shortness of breath, cold or clammy skin, feeling faint or lightheaded Mood and behavior changes--anxiety, nervousness, confusion, hallucinations, irritability, hostility, thoughts of suicide or self-harm, worsening mood, feelings of depression Redness, blistering, peeling, or loosening of the skin,  including inside the mouth Stroke--sudden numbness or weakness of the face, arm, or leg, trouble speaking, confusion, trouble walking, loss of balance or coordination, dizziness, severe headache, change in vision Seizures Side effects that usually do not require medical attention (report to your care team if they continue or are bothersome): Constipation Drowsiness Gas Nausea Trouble sleeping Upset stomach Vivid dreams or nightmares Vomiting This list may not describe all possible side effects. Call your doctor for medical advice about side effects. You may report side effects to FDA at 1-800-FDA-1088. Where should I keep my medication? Keep out of the reach of children and pets. Store at room temperature between 15 and 30 degrees C (59 and 86 degrees F). Throw away any unused medication after the expiration date. NOTE: This sheet is a summary. It may not cover all possible information. If you have questions about this medicine, talk to your doctor, pharmacist, or health care provider.  2023 Elsevier/Gold Standard (2021-11-05 00:00:00)  

## 2022-07-09 ENCOUNTER — Encounter: Payer: Self-pay | Admitting: Family Medicine

## 2022-07-13 ENCOUNTER — Encounter: Payer: Self-pay | Admitting: Family Medicine

## 2022-07-13 NOTE — Telephone Encounter (Signed)
Nurses Please make sure that Garrett Lee is aware that I did do a letter of appeal.  The difficult part of this particular situation is the nursing staff let me know that according to his insurance they are listing this as a drug exclusion meaning that it is not covered under his plan no matter what.  It is reasonable for him to submit the letter but is quite possible this will not get approved.  As for his elevated liver enzymes I highly recommend healthy eating regular activity trying to lose weight plus also repeating liver enzymes in approximately 3 months with follow-up office visit.  Currently we are awaiting the results of his ultrasound which will be done in the near future

## 2022-07-14 ENCOUNTER — Ambulatory Visit (HOSPITAL_COMMUNITY)
Admission: RE | Admit: 2022-07-14 | Discharge: 2022-07-14 | Disposition: A | Discharge status: Home / Self Care | Payer: No Typology Code available for payment source | Source: Ambulatory Visit | Attending: Family Medicine | Admitting: Family Medicine

## 2022-07-14 DIAGNOSIS — K760 Fatty (change of) liver, not elsewhere classified: Secondary | ICD-10-CM | POA: Insufficient documentation

## 2022-07-14 NOTE — Telephone Encounter (Signed)
Patient notified and verbalized understanding and will pick letter up from the front desk

## 2022-07-30 ENCOUNTER — Ambulatory Visit (INDEPENDENT_AMBULATORY_CARE_PROVIDER_SITE_OTHER): Payer: No Typology Code available for payment source | Admitting: Urology

## 2022-07-30 ENCOUNTER — Encounter: Payer: Self-pay | Admitting: Urology

## 2022-07-30 VITALS — BP 158/95 | HR 70

## 2022-07-30 DIAGNOSIS — Z3009 Encounter for other general counseling and advice on contraception: Secondary | ICD-10-CM

## 2022-07-30 NOTE — Patient Instructions (Signed)
Vasectomy Vasectomy is a procedure in which the vas deferens is cut and then tied or burned (cauterized). The vas deferens is a tube that carries sperm from the testicle to the part of the body that drains urine from the bladder (urethra). This procedure blocks sperm from going through the vas deferens and penis during ejaculation. This ensures that sperm does not go into the vagina during sex. Vasectomy does not affect sexual desire or performance and does not prevent sexually transmitted infections. Vasectomy is considered a permanent and very effective form of birth control (contraception). The decision to have a vasectomy should not be made during a stressful time, such as after the loss of a pregnancy or a divorce. You and your partner should decide on whether to have a vasectomy when you are sure that you do not want children in the future. Tell a health care provider about: Any allergies you have. All medicines you are taking, including vitamins, herbs, eye drops, creams, and over-the-counter medicines. Any problems you or family members have had with anesthetic medicines. Any blood disorders you have. Any surgeries you have had. Any medical conditions you have. What are the risks? Generally, this is a safe procedure. However, problems may occur, including: Infection. Bleeding and swelling of the scrotum. The scrotum is the sac that contains the testicles, blood vessels, and structures that help deliver sperm and semen. Allergic reactions to medicines. Failure of the procedure to prevent pregnancy. There is a very small chance that the tied or cauterized ends of the vas deferens may reconnect (recanalization). If this happens, you could still make a woman pregnant. Pain in the scrotum that continues after you heal from the procedure. What happens before the procedure? Medicines Ask your health care provider about: Changing or stopping your regular medicines. This is especially important if  you are taking diabetes medicines or blood thinners. Taking medicines such as aspirin and ibuprofen. These medicines can thin your blood. Do not take these medicines unless your health care provider tells you to take them. Taking over-the-counter medicines, vitamins, herbs, and supplements. You may be told to take a medicine to help you relax (sedative) a few hours before the procedure. General instructions Do not use any products that contain nicotine or tobacco for at least 4 weeks before the procedure. These products include cigarettes, e-cigarettes, and chewing tobacco. If you need help quitting, ask your health care provider. Plan to have a responsible adult take you home from the hospital or clinic. If you will be going home right after the procedure, plan to have a responsible adult care for you for the time you are told. This is important. Ask your health care provider: How your surgery site will be marked. What steps will be taken to help prevent infection. These steps may include: Removing hair at the surgery site. Washing skin with a germ-killing soap. Taking antibiotic medicine. What happens during the procedure?  You will be given one or more of the following: A sedative, unless you were told to take this a few hours before the procedure. A medicine to numb the area (local anesthetic). Your health care provider will feel, or palpate, for your vas deferens. To reach the vas deferens, one of two methods may be used: A very small incision may be made in your scrotum. A punctured opening may be made in your scrotum, without an incision. Your vas deferens will be pulled out of your scrotum and cut. Then, the vas deferens will be closed   in one of two ways: Tied at the ends. Cauterized at the ends to seal them off. The vas deferens will be put back into your scrotum. The incision or puncture opening will be closed with absorbable stitches (sutures). The sutures will eventually  dissolve and will not need to be removed after the procedure. The procedure will be repeated on the other side of your scrotum. The procedure may vary among health care providers and hospitals. What happens after the procedure? You will be monitored to make sure that you do not have problems. You will be asked not to ejaculate for at least 1 week after the procedure, or for as long as you are told. You will need to use a different form of contraception for 2-4 months after the procedure, until you have test results confirming that there are no sperm in your semen. You may be given scrotal support to wear, such as a jockstrap or underwear with a supportive pouch. If you were given a sedative during the procedure, it can affect you for several hours. Do not drive or operate machinery until your health care provider says that it is safe. Summary Vasectomy blocks sperm from being released during ejaculation. This procedure is considered a permanent and very effective form of birth control. Your scrotum will be numbed with medicine (local anesthetic) for the procedure. After the procedure, you will be asked not to ejaculate for at least 1 week, or for as long as you are told. You will also need to use a different form of contraception until your test results confirm that there are no sperm in your semen. This information is not intended to replace advice given to you by your health care provider. Make sure you discuss any questions you have with your health care provider. Document Revised: 05/01/2020 Document Reviewed: 05/01/2020 Elsevier Patient Education  2023 Elsevier Inc.  

## 2022-07-30 NOTE — Progress Notes (Signed)
07/30/2022 11:51 AM   Garrett Lee 08-Feb-1996 [id number]  Referring provider: Babs Sciara, MD [address] [place],  [place] [address]  Desires sterilization   HPI: [name] is a 26yo here for evaluation of vasectomy. He has 6 children, 4 biological. No scrotal surgeries. No testicular/prostate infections. No erectile dysfunction.    PMH: No past medical history on file.  Surgical History: Past Surgical History:  Procedure Laterality Date   FIBULA FRACTURE SURGERY     SHOULDER SURGERY     tubes in ears      Home Medications:  Allergies as of 07/30/2022   No Known Allergies      Medication List        Accurate as of July 30, 2022 11:51 AM. If you have any questions, ask your nurse or doctor.          buPROPion 150 MG 24 hr tablet Commonly known as: Wellbutrin XL Take 1 tablet (150 mg total) by mouth daily.   varenicline 1 MG tablet Commonly known as: Chantix Continuing Month Pak Take 1 tablet (1 mg total) by mouth 2 (two) times daily.   Chantix Starting Month Pak 0.5 MG X 11 & 1 MG X 42 Tbpk Generic drug: Varenicline Tartrate (Starter) Take as directed   Wegovy 0.25 MG/0.5ML Soaj Generic drug: Semaglutide-Weight Management Inject 0.25 mg into skin once a week        Allergies: No Known Allergies  Family History: No family history on file.  Social History:  reports that he has been smoking. He uses smokeless tobacco. He reports that he does not drink alcohol and does not use drugs.  ROS: All other review of systems were reviewed and are negative except what is noted above in HPI  Physical Exam: BP (!) 158/95   Pulse 70   Constitutional:  Alert and oriented, No acute distress. HEENT: [place] AT, moist mucus membranes.  Trachea midline, no masses. Cardiovascular: No clubbing, cyanosis, or edema. Respiratory: Normal respiratory effort, no increased work of breathing. GI: Abdomen is soft, nontender, nondistended, no abdominal  masses GU: No CVA tenderness. Circumcised phallus. No masses/lesions on penis, testis, scrotum.  Right retractile testis Lymph: No cervical or inguinal lymphadenopathy. Skin: No rashes, bruises or suspicious lesions. Neurologic: Grossly intact, no focal deficits, moving all 4 extremities. Psychiatric: Normal mood and affect.  Laboratory Data: Lab Results  Component Value Date   WBC 11.7 (H) 12/31/2021   HGB 16.7 12/31/2021   HCT 48.2 12/31/2021   MCV 90 12/31/2021   PLT 177 12/31/2021    Lab Results  Component Value Date   CREATININE 0.85 12/31/2021    No results found for: "PSA"  No results found for: "TESTOSTERONE"  No results found for: "HGBA1C"  Urinalysis    Component Value Date/Time   COLORURINE YELLOW 02/13/2015 2231   APPEARANCEUR CLEAR 02/13/2015 2231   LABSPEC >1.030 (H) 02/13/2015 2231   PHURINE 6.0 02/13/2015 2231   GLUCOSEU NEGATIVE 02/13/2015 2231   HGBUR NEGATIVE 02/13/2015 2231   BILIRUBINUR NEGATIVE 02/13/2015 2231   KETONESUR NEGATIVE 02/13/2015 2231   PROTEINUR NEGATIVE 02/13/2015 2231   UROBILINOGEN 0.2 02/13/2015 2231   NITRITE NEGATIVE 02/13/2015 2231   LEUKOCYTESUR NEGATIVE 02/13/2015 2231    No results found for: "LABMICR", "WBCUA", "RBCUA", "LABEPIT", "MUCUS", "BACTERIA"  Pertinent Imaging:  No results found for this or any previous visit.  No results found for this or any previous visit.  No results found for this or any  previous visit.  No results found for this or any previous visit.  No results found for this or any previous visit.  No results found for this or any previous visit.  No results found for this or any previous visit.  No results found for this or any previous visit.   Assessment & Plan:    1. Vasectomy evaluation Schedule for vasectomy in Operating room due to right retractile testis.    No follow-ups on file.  Garrett Aye, MD  [facility] [place]

## 2022-08-05 ENCOUNTER — Telehealth: Payer: Self-pay

## 2022-08-05 NOTE — Telephone Encounter (Signed)
Received surgical posting sheet. Called patient to schedule- no answer Left message to return call to office

## 2022-08-05 NOTE — Telephone Encounter (Signed)
I spoke with Mr. Osorto. We have discussed possible surgery dates and 09/02/2022 was agreed upon by all parties. Patient given information about surgery date, what to expect pre-operatively and post operatively.    We discussed that a pre-op nurse will be calling to set up the pre-op visit that will take place prior to surgery. Informed patient that our office will communicate any additional care to be provided after surgery.    Patients questions or concerns were discussed during our call. Advised to call our office should there be any additional information, questions or concerns that arise. Patient verbalized understanding.

## 2022-08-10 ENCOUNTER — Other Ambulatory Visit: Payer: Self-pay

## 2022-08-10 DIAGNOSIS — Z9852 Vasectomy status: Secondary | ICD-10-CM

## 2022-08-26 NOTE — Patient Instructions (Signed)
Garrett Lee  08/26/2022     @PREFPERIOPPHARMACY @   Your procedure is scheduled on  09/02/2022.   Report to [facility] at  [phone]  A.M.   Call this number if you have problems the morning of surgery:  [phone]   Remember:  Do not eat or drink after midnight.      Take these medicines the morning of surgery with A SIP OF WATER                                                 None    Do not wear jewelry, make-up or nail polish.  Do not wear lotions, powders, or perfumes, or deodorant.  Do not shave 48 hours prior to surgery.  Men may shave face and neck.  Do not bring valuables to the hospital.  [facility] belongings or valuables.  Contacts, dentures or bridgework may not be worn into surgery.  Leave your suitcase in the car.  After surgery it may be brought to your room.  For patients admitted to the hospital, discharge time will be determined by your treatment team.  Patients discharged the day of surgery will not be allowed to drive home and must have someone with them for 24 hours.     Special instructions:   DO NOT smoke tobacco or vape for 24 hours before your procedure.  Please read over the following fact sheets that you were given. Coughing and Deep Breathing, Surgical Site Infection Prevention, Anesthesia Post-op Instructions, and Care and Recovery After Surgery      Vasectomy, Care After This sheet gives you information about how to care for yourself after your procedure. Your health care provider may also give you more specific instructions. If you have problems or questions, contact your health care provider. What can I expect after the procedure? After the procedure, it is common to have: Mild pain, swelling, or discomfort in your scrotum or redness on your scrotum. Some blood coming from your incisions or puncture sites for 1 or 2 days. Blood in your semen. Follow these instructions at home: Medicines Take  over-the-counter and prescription medicines only as told by your health care provider. Avoid taking any medicines that contain aspirin or NSAIDs, such as ibuprofen. These medicines can make bleeding worse. Activity For the first 2 days after surgery, avoid physical activity and exercise that requires a lot of energy. Ask your health care provider what activities are safe for you. Do not take part in sports or perform heavy physical labor until your pain has improved, or until your health care provider says it is okay. You may have limits on the amount of weight you can lift as told by your health care provider. Do not ejaculate for at least 1 week after the procedure, or for as long as you are told. You may resume sexual activity 7-10 days after your procedure, or when your health care provider approves. Use a different method of birth control (contraception) until you have had test results that confirm that there is no sperm in your semen. Scrotal support Use scrotal support, such as a jockstrap or underwear with a supportive pouch, as needed for 1 week after your procedure. If you feel discomfort in your scrotum, you may remove the scrotal  support to see if the discomfort is relieved. Sometimes scrotal support can press on the scrotum and cause or worsen discomfort. If your skin gets irritated, you may add some germ-free (sterile), fluffed bandages or a clean washcloth to the scrotal support. Managing pain and swelling If directed, put ice on the affected area. To do this: Put ice in a plastic bag. Place a towel between your skin and the bag. Leave the ice on for 20 minutes, 2-3 times a day. Remove the ice if your skin turns bright red. This is very important. If you cannot feel pain, heat, or cold, you have a greater risk of damage to the area.  General instructions  Check your incisions or puncture sites every day for signs of infection. Check for: Redness, swelling, or more pain. Fluid or  blood. Warmth. Pus or a bad smell. Leave stitches (sutures) in place. The sutures will dissolve on their own and do not need to be removed. Keep all follow-up visits. This is important because you will need a test to confirm that there is no sperm in your semen. Multiple ejaculations are needed to clear out sperm that were beyond the vasectomy site. You will need one test result showing that there is no sperm in your semen before you can resume unprotected sex. This may take 2-4 months after your procedure. If you were given a sedative during the procedure, it can affect you for several hours. Do not drive or operate machinery until your health care provider says that it is safe. Contact a health care provider if: You have redness, swelling, or more pain around an incision or puncture site, or in your scrotum area. You have bleeding from an incision or puncture site. You have pus or a bad smell coming from an incision or puncture site. You have a fever. An incision or puncture site opens up. Get help right away if: You develop a rash. You have trouble breathing. Summary After your procedure, it is common to have mild pain, swelling, redness, or discomfort in your scrotum. For the first 2 days after surgery, avoid physical activity and exercise that requires a lot of energy. Put ice on the affected area. Leave the ice on for 20 minutes, 2-3 times a day. If you were given a sedative during the procedure, it can affect you for several hours. Do not drive or operate machinery until your health care provider says that it is safe. This information is not intended to replace advice given to you by your health care provider. Make sure you discuss any questions you have with your health care provider. Document Revised: 05/01/2020 Document Reviewed: 05/01/2020 Elsevier Patient Education  2023 [employer] General Anesthesia, Adult, Care After The following information offers guidance on how to care  for yourself after your procedure. Your health care provider may also give you more specific instructions. If you have problems or questions, contact your health care provider. What can I expect after the procedure? After the procedure, it is common for people to: Have pain or discomfort at the IV site. Have nausea or vomiting. Have a sore throat or hoarseness. Have trouble concentrating. Feel cold or chills. Feel weak, sleepy, or tired (fatigue). Have soreness and body aches. These can affect parts of the body that were not involved in surgery. Follow these instructions at home: For the time period you were told by your health care provider:  Rest. Do not participate in activities where you could fall or become injured.  Do not drive or use machinery. Do not drink alcohol. Do not take sleeping pills or medicines that cause drowsiness. Do not make important decisions or sign legal documents. Do not take care of children on your own. General instructions Drink enough fluid to keep your urine pale yellow. If you have sleep apnea, surgery and certain medicines can increase your risk for breathing problems. Follow instructions from your health care provider about wearing your sleep device: Anytime you are sleeping, including during daytime naps. While taking prescription pain medicines, sleeping medicines, or medicines that make you drowsy. Return to your normal activities as told by your health care provider. Ask your health care provider what activities are safe for you. Take over-the-counter and prescription medicines only as told by your health care provider. Do not use any products that contain nicotine or tobacco. These products include cigarettes, chewing tobacco, and vaping devices, such as e-cigarettes. These can delay incision healing after surgery. If you need help quitting, ask your health care provider. Contact a health care provider if: You have nausea or vomiting that does not  get better with medicine. You vomit every time you eat or drink. You have pain that does not get better with medicine. You cannot urinate or have bloody urine. You develop a skin rash. You have a fever. Get help right away if: You have trouble breathing. You have chest pain. You vomit blood. These symptoms may be an emergency. Get help right away. Call 911. Do not wait to see if the symptoms will go away. Do not drive yourself to the hospital. Summary After the procedure, it is common to have a sore throat, hoarseness, nausea, vomiting, or to feel weak, sleepy, or fatigue. For the time period you were told by your health care provider, do not drive or use machinery. Get help right away if you have difficulty breathing, have chest pain, or vomit blood. These symptoms may be an emergency. This information is not intended to replace advice given to you by your health care provider. Make sure you discuss any questions you have with your health care provider. Document Revised: 03/12/2022 Document Reviewed: 03/12/2022 Elsevier Patient Education  2023 [employer] How to Use Chlorhexidine Before Surgery Chlorhexidine gluconate (CHG) is a germ-killing (antiseptic) solution that is used to clean the skin. It can get rid of the bacteria that normally live on the skin and can keep them away for about 24 hours. To clean your skin with CHG, you may be given: A CHG solution to use in the shower or as part of a sponge bath. A prepackaged cloth that contains CHG. Cleaning your skin with CHG may help lower the risk for infection: While you are staying in the intensive care unit of the hospital. If you have a vascular access, such as a central line, to provide short-term or long-term access to your veins. If you have a catheter to drain urine from your bladder. If you are on a ventilator. A ventilator is a machine that helps you breathe by moving air in and out of your lungs. After surgery. What are  the risks? Risks of using CHG include: A skin reaction. Hearing loss, if CHG gets in your ears and you have a perforated eardrum. Eye injury, if CHG gets in your eyes and is not rinsed out. The CHG product catching fire. Make sure that you avoid smoking and flames after applying CHG to your skin. Do not use CHG: If you have a chlorhexidine allergy or  have previously reacted to chlorhexidine. On babies younger than 32 months of age. How to use CHG solution Use CHG only as told by your health care provider, and follow the instructions on the label. Use the full amount of CHG as directed. Usually, this is one bottle. During a shower Follow these steps when using CHG solution during a shower (unless your health care provider gives you different instructions): Start the shower. Use your normal soap and shampoo to wash your face and hair. Turn off the shower or move out of the shower stream. Pour the CHG onto a clean washcloth. Do not use any type of brush or rough-edged sponge. Starting at your neck, lather your body down to your toes. Make sure you follow these instructions: If you will be having surgery, pay special attention to the part of your body where you will be having surgery. Scrub this area for at least 1 minute. Do not use CHG on your head or face. If the solution gets into your ears or eyes, rinse them well with water. Avoid your genital area. Avoid any areas of skin that have broken skin, cuts, or scrapes. Scrub your back and under your arms. Make sure to wash skin folds. Let the lather sit on your skin for 1-2 minutes or as long as told by your health care provider. Thoroughly rinse your entire body in the shower. Make sure that all body creases and crevices are rinsed well. Dry off with a clean towel. Do not put any substances on your body afterward--such as powder, lotion, or perfume--unless you are told to do so by your health care provider. Only use lotions that are  recommended by the manufacturer. Put on clean clothes or pajamas. If it is the night before your surgery, sleep in clean sheets.  During a sponge bath Follow these steps when using CHG solution during a sponge bath (unless your health care provider gives you different instructions): Use your normal soap and shampoo to wash your face and hair. Pour the CHG onto a clean washcloth. Starting at your neck, lather your body down to your toes. Make sure you follow these instructions: If you will be having surgery, pay special attention to the part of your body where you will be having surgery. Scrub this area for at least 1 minute. Do not use CHG on your head or face. If the solution gets into your ears or eyes, rinse them well with water. Avoid your genital area. Avoid any areas of skin that have broken skin, cuts, or scrapes. Scrub your back and under your arms. Make sure to wash skin folds. Let the lather sit on your skin for 1-2 minutes or as long as told by your health care provider. Using a different clean, wet washcloth, thoroughly rinse your entire body. Make sure that all body creases and crevices are rinsed well. Dry off with a clean towel. Do not put any substances on your body afterward--such as powder, lotion, or perfume--unless you are told to do so by your health care provider. Only use lotions that are recommended by the manufacturer. Put on clean clothes or pajamas. If it is the night before your surgery, sleep in clean sheets. How to use CHG prepackaged cloths Only use CHG cloths as told by your health care provider, and follow the instructions on the label. Use the CHG cloth on clean, dry skin. Do not use the CHG cloth on your head or face unless your health care provider tells  you to. When washing with the CHG cloth: Avoid your genital area. Avoid any areas of skin that have broken skin, cuts, or scrapes. Before surgery Follow these steps when using a CHG cloth to clean before  surgery (unless your health care provider gives you different instructions): Using the CHG cloth, vigorously scrub the part of your body where you will be having surgery. Scrub using a back-and-forth motion for 3 minutes. The area on your body should be completely wet with CHG when you are done scrubbing. Do not rinse. Discard the cloth and let the area air-dry. Do not put any substances on the area afterward, such as powder, lotion, or perfume. Put on clean clothes or pajamas. If it is the night before your surgery, sleep in clean sheets.  For general bathing Follow these steps when using CHG cloths for general bathing (unless your health care provider gives you different instructions). Use a separate CHG cloth for each area of your body. Make sure you wash between any folds of skin and between your fingers and toes. Wash your body in the following order, switching to a new cloth after each step: The front of your neck, shoulders, and chest. Both of your arms, under your arms, and your hands. Your stomach and groin area, avoiding the genitals. Your right leg and foot. Your left leg and foot. The back of your neck, your back, and your buttocks. Do not rinse. Discard the cloth and let the area air-dry. Do not put any substances on your body afterward--such as powder, lotion, or perfume--unless you are told to do so by your health care provider. Only use lotions that are recommended by the manufacturer. Put on clean clothes or pajamas. Contact a health care provider if: Your skin gets irritated after scrubbing. You have questions about using your solution or cloth. You swallow any chlorhexidine. Call your local poison control center ([phone] in the [place] Get help right away if: Your eyes itch badly, or they become very red or swollen. Your skin itches badly and is red or swollen. Your hearing changes. You have trouble seeing. You have swelling or tingling in your mouth or throat. You  have trouble breathing. These symptoms may represent a serious problem that is an emergency. Do not wait to see if the symptoms will go away. Get medical help right away. Call your local emergency services (911 in the [place] Do not drive yourself to the hospital. Summary Chlorhexidine gluconate (CHG) is a germ-killing (antiseptic) solution that is used to clean the skin. Cleaning your skin with CHG may help to lower your risk for infection. You may be given CHG to use for bathing. It may be in a bottle or in a prepackaged cloth to use on your skin. Carefully follow your health care provider's instructions and the instructions on the product label. Do not use CHG if you have a chlorhexidine allergy. Contact your health care provider if your skin gets irritated after scrubbing. This information is not intended to replace advice given to you by your health care provider. Make sure you discuss any questions you have with your health care provider. Document Revised: 04/12/2022 Document Reviewed: 02/23/2021 Elsevier Patient Education  2023 [employer].

## 2022-08-27 ENCOUNTER — Encounter (HOSPITAL_COMMUNITY): Payer: Self-pay

## 2022-08-27 ENCOUNTER — Encounter (HOSPITAL_COMMUNITY)
Admission: RE | Admit: 2022-08-27 | Discharge: 2022-08-27 | Disposition: A | Discharge status: Home / Self Care | Payer: No Typology Code available for payment source | Source: Ambulatory Visit | Attending: Urology | Admitting: Urology

## 2022-08-27 DIAGNOSIS — E669 Obesity, unspecified: Secondary | ICD-10-CM

## 2022-08-27 DIAGNOSIS — F17200 Nicotine dependence, unspecified, uncomplicated: Secondary | ICD-10-CM

## 2022-08-27 NOTE — Pre-Procedure Instructions (Signed)
Garrett Martinez, RN messaged because patient did not show for his pre-op today.

## 2022-08-31 NOTE — Patient Instructions (Signed)
Garrett Lee  08/31/2022     @PREFPERIOPPHARMACY @   Your procedure is scheduled on  09/02/2022.   Report to 11/02/2022 at  0700  A.M.   Call this number if you have problems the morning of surgery:  [phone]   Remember:  Do not eat or drink after midnight.      Take these medicines the morning of surgery with A SIP OF WATER                                                        None.     Do not wear jewelry, make-up or nail polish.  Do not wear lotions, powders, or perfumes, or deodorant.  Do not shave 48 hours prior to surgery.  Men may shave face and neck.  Do not bring valuables to the hospital.  [facility] is not responsible for any belongings or valuables.  Contacts, dentures or bridgework may not be worn into surgery.  Leave your suitcase in the car.  After surgery it may be brought to your room.  For patients admitted to the hospital, discharge time will be determined by your treatment team.  Patients discharged the day of surgery will not be allowed to drive home and must have someone with them for 24 hours.    Special instructions:   DO NOT smoke tobacco or vape for 24 hours before your procedure.  Please read over the following fact sheets that you were given. Coughing and Deep Breathing, Surgical Site Infection Prevention, Anesthesia Post-op Instructions, and Care and Recovery After Surgery      Vasectomy, Care After This sheet gives you information about how to care for yourself after your procedure. Your health care provider may also give you more specific instructions. If you have problems or questions, contact your health care provider. What can I expect after the procedure? After the procedure, it is common to have: Mild pain, swelling, or discomfort in your scrotum or redness on your scrotum. Some blood coming from your incisions or puncture sites for 1 or 2 days. Blood in your semen. Follow these instructions at  home: Medicines Take over-the-counter and prescription medicines only as told by your health care provider. Avoid taking any medicines that contain aspirin or NSAIDs, such as ibuprofen. These medicines can make bleeding worse. Activity For the first 2 days after surgery, avoid physical activity and exercise that requires a lot of energy. Ask your health care provider what activities are safe for you. Do not take part in sports or perform heavy physical labor until your pain has improved, or until your health care provider says it is okay. You may have limits on the amount of weight you can lift as told by your health care provider. Do not ejaculate for at least 1 week after the procedure, or for as long as you are told. You may resume sexual activity 7-10 days after your procedure, or when your health care provider approves. Use a different method of birth control (contraception) until you have had test results that confirm that there is no sperm in your semen. Scrotal support Use scrotal support, such as a jockstrap or underwear with a supportive pouch, as needed for 1 week after your procedure. If you feel discomfort in  your scrotum, you may remove the scrotal support to see if the discomfort is relieved. Sometimes scrotal support can press on the scrotum and cause or worsen discomfort. If your skin gets irritated, you may add some germ-free (sterile), fluffed bandages or a clean washcloth to the scrotal support. Managing pain and swelling If directed, put ice on the affected area. To do this: Put ice in a plastic bag. Place a towel between your skin and the bag. Leave the ice on for 20 minutes, 2-3 times a day. Remove the ice if your skin turns bright red. This is very important. If you cannot feel pain, heat, or cold, you have a greater risk of damage to the area.  General instructions  Check your incisions or puncture sites every day for signs of infection. Check for: Redness, swelling,  or more pain. Fluid or blood. Warmth. Pus or a bad smell. Leave stitches (sutures) in place. The sutures will dissolve on their own and do not need to be removed. Keep all follow-up visits. This is important because you will need a test to confirm that there is no sperm in your semen. Multiple ejaculations are needed to clear out sperm that were beyond the vasectomy site. You will need one test result showing that there is no sperm in your semen before you can resume unprotected sex. This may take 2-4 months after your procedure. If you were given a sedative during the procedure, it can affect you for several hours. Do not drive or operate machinery until your health care provider says that it is safe. Contact a health care provider if: You have redness, swelling, or more pain around an incision or puncture site, or in your scrotum area. You have bleeding from an incision or puncture site. You have pus or a bad smell coming from an incision or puncture site. You have a fever. An incision or puncture site opens up. Get help right away if: You develop a rash. You have trouble breathing. Summary After your procedure, it is common to have mild pain, swelling, redness, or discomfort in your scrotum. For the first 2 days after surgery, avoid physical activity and exercise that requires a lot of energy. Put ice on the affected area. Leave the ice on for 20 minutes, 2-3 times a day. If you were given a sedative during the procedure, it can affect you for several hours. Do not drive or operate machinery until your health care provider says that it is safe. This information is not intended to replace advice given to you by your health care provider. Make sure you discuss any questions you have with your health care provider. Document Revised: 05/01/2020 Document Reviewed: 05/01/2020 Elsevier Patient Education  2023 [employer] General Anesthesia, Adult, Care After The following information offers  guidance on how to care for yourself after your procedure. Your health care provider may also give you more specific instructions. If you have problems or questions, contact your health care provider. What can I expect after the procedure? After the procedure, it is common for people to: Have pain or discomfort at the IV site. Have nausea or vomiting. Have a sore throat or hoarseness. Have trouble concentrating. Feel cold or chills. Feel weak, sleepy, or tired (fatigue). Have soreness and body aches. These can affect parts of the body that were not involved in surgery. Follow these instructions at home: For the time period you were told by your health care provider:  Rest. Do not participate in activities  where you could fall or become injured. Do not drive or use machinery. Do not drink alcohol. Do not take sleeping pills or medicines that cause drowsiness. Do not make important decisions or sign legal documents. Do not take care of children on your own. General instructions Drink enough fluid to keep your urine pale yellow. If you have sleep apnea, surgery and certain medicines can increase your risk for breathing problems. Follow instructions from your health care provider about wearing your sleep device: Anytime you are sleeping, including during daytime naps. While taking prescription pain medicines, sleeping medicines, or medicines that make you drowsy. Return to your normal activities as told by your health care provider. Ask your health care provider what activities are safe for you. Take over-the-counter and prescription medicines only as told by your health care provider. Do not use any products that contain nicotine or tobacco. These products include cigarettes, chewing tobacco, and vaping devices, such as e-cigarettes. These can delay incision healing after surgery. If you need help quitting, ask your health care provider. Contact a health care provider if: You have nausea or  vomiting that does not get better with medicine. You vomit every time you eat or drink. You have pain that does not get better with medicine. You cannot urinate or have bloody urine. You develop a skin rash. You have a fever. Get help right away if: You have trouble breathing. You have chest pain. You vomit blood. These symptoms may be an emergency. Get help right away. Call 911. Do not wait to see if the symptoms will go away. Do not drive yourself to the hospital. Summary After the procedure, it is common to have a sore throat, hoarseness, nausea, vomiting, or to feel weak, sleepy, or fatigue. For the time period you were told by your health care provider, do not drive or use machinery. Get help right away if you have difficulty breathing, have chest pain, or vomit blood. These symptoms may be an emergency. This information is not intended to replace advice given to you by your health care provider. Make sure you discuss any questions you have with your health care provider. Document Revised: 03/12/2022 Document Reviewed: 03/12/2022 Elsevier Patient Education  2023 [employer]. How to Use Chlorhexidine Before Surgery Chlorhexidine gluconate (CHG) is a germ-killing (antiseptic) solution that is used to clean the skin. It can get rid of the bacteria that normally live on the skin and can keep them away for about 24 hours. To clean your skin with CHG, you may be given: A CHG solution to use in the shower or as part of a sponge bath. A prepackaged cloth that contains CHG. Cleaning your skin with CHG may help lower the risk for infection: While you are staying in the intensive care unit of the hospital. If you have a vascular access, such as a central line, to provide short-term or long-term access to your veins. If you have a catheter to drain urine from your bladder. If you are on a ventilator. A ventilator is a machine that helps you breathe by moving air in and out of your  lungs. After surgery. What are the risks? Risks of using CHG include: A skin reaction. Hearing loss, if CHG gets in your ears and you have a perforated eardrum. Eye injury, if CHG gets in your eyes and is not rinsed out. The CHG product catching fire. Make sure that you avoid smoking and flames after applying CHG to your skin. Do not use CHG:  If you have a chlorhexidine allergy or have previously reacted to chlorhexidine. On babies younger than 1052 months of age. How to use CHG solution Use CHG only as told by your health care provider, and follow the instructions on the label. Use the full amount of CHG as directed. Usually, this is one bottle. During a shower Follow these steps when using CHG solution during a shower (unless your health care provider gives you different instructions): Start the shower. Use your normal soap and shampoo to wash your face and hair. Turn off the shower or move out of the shower stream. Pour the CHG onto a clean washcloth. Do not use any type of brush or rough-edged sponge. Starting at your neck, lather your body down to your toes. Make sure you follow these instructions: If you will be having surgery, pay special attention to the part of your body where you will be having surgery. Scrub this area for at least 1 minute. Do not use CHG on your head or face. If the solution gets into your ears or eyes, rinse them well with water. Avoid your genital area. Avoid any areas of skin that have broken skin, cuts, or scrapes. Scrub your back and under your arms. Make sure to wash skin folds. Let the lather sit on your skin for 1-2 minutes or as long as told by your health care provider. Thoroughly rinse your entire body in the shower. Make sure that all body creases and crevices are rinsed well. Dry off with a clean towel. Do not put any substances on your body afterward--such as powder, lotion, or perfume--unless you are told to do so by your health care provider.  Only use lotions that are recommended by the manufacturer. Put on clean clothes or pajamas. If it is the night before your surgery, sleep in clean sheets.  During a sponge bath Follow these steps when using CHG solution during a sponge bath (unless your health care provider gives you different instructions): Use your normal soap and shampoo to wash your face and hair. Pour the CHG onto a clean washcloth. Starting at your neck, lather your body down to your toes. Make sure you follow these instructions: If you will be having surgery, pay special attention to the part of your body where you will be having surgery. Scrub this area for at least 1 minute. Do not use CHG on your head or face. If the solution gets into your ears or eyes, rinse them well with water. Avoid your genital area. Avoid any areas of skin that have broken skin, cuts, or scrapes. Scrub your back and under your arms. Make sure to wash skin folds. Let the lather sit on your skin for 1-2 minutes or as long as told by your health care provider. Using a different clean, wet washcloth, thoroughly rinse your entire body. Make sure that all body creases and crevices are rinsed well. Dry off with a clean towel. Do not put any substances on your body afterward--such as powder, lotion, or perfume--unless you are told to do so by your health care provider. Only use lotions that are recommended by the manufacturer. Put on clean clothes or pajamas. If it is the night before your surgery, sleep in clean sheets. How to use CHG prepackaged cloths Only use CHG cloths as told by your health care provider, and follow the instructions on the label. Use the CHG cloth on clean, dry skin. Do not use the CHG cloth on your head or  face unless your health care provider tells you to. When washing with the CHG cloth: Avoid your genital area. Avoid any areas of skin that have broken skin, cuts, or scrapes. Before surgery Follow these steps when using a  CHG cloth to clean before surgery (unless your health care provider gives you different instructions): Using the CHG cloth, vigorously scrub the part of your body where you will be having surgery. Scrub using a back-and-forth motion for 3 minutes. The area on your body should be completely wet with CHG when you are done scrubbing. Do not rinse. Discard the cloth and let the area air-dry. Do not put any substances on the area afterward, such as powder, lotion, or perfume. Put on clean clothes or pajamas. If it is the night before your surgery, sleep in clean sheets.  For general bathing Follow these steps when using CHG cloths for general bathing (unless your health care provider gives you different instructions). Use a separate CHG cloth for each area of your body. Make sure you wash between any folds of skin and between your fingers and toes. Wash your body in the following order, switching to a new cloth after each step: The front of your neck, shoulders, and chest. Both of your arms, under your arms, and your hands. Your stomach and groin area, avoiding the genitals. Your right leg and foot. Your left leg and foot. The back of your neck, your back, and your buttocks. Do not rinse. Discard the cloth and let the area air-dry. Do not put any substances on your body afterward--such as powder, lotion, or perfume--unless you are told to do so by your health care provider. Only use lotions that are recommended by the manufacturer. Put on clean clothes or pajamas. Contact a health care provider if: Your skin gets irritated after scrubbing. You have questions about using your solution or cloth. You swallow any chlorhexidine. Call your local poison control center (5397488847 in the [place] Get help right away if: Your eyes itch badly, or they become very red or swollen. Your skin itches badly and is red or swollen. Your hearing changes. You have trouble seeing. You have swelling or tingling in  your mouth or throat. You have trouble breathing. These symptoms may represent a serious problem that is an emergency. Do not wait to see if the symptoms will go away. Get medical help right away. Call your local emergency services (911 in the [place] Do not drive yourself to the hospital. Summary Chlorhexidine gluconate (CHG) is a germ-killing (antiseptic) solution that is used to clean the skin. Cleaning your skin with CHG may help to lower your risk for infection. You may be given CHG to use for bathing. It may be in a bottle or in a prepackaged cloth to use on your skin. Carefully follow your health care provider's instructions and the instructions on the product label. Do not use CHG if you have a chlorhexidine allergy. Contact your health care provider if your skin gets irritated after scrubbing. This information is not intended to replace advice given to you by your health care provider. Make sure you discuss any questions you have with your health care provider. Document Revised: 04/12/2022 Document Reviewed: 02/23/2021 Elsevier Patient Education  2023 [employer].

## 2022-09-01 ENCOUNTER — Encounter (HOSPITAL_COMMUNITY)
Admission: RE | Admit: 2022-09-01 | Discharge: 2022-09-01 | Disposition: A | Discharge status: Home / Self Care | Payer: No Typology Code available for payment source | Source: Ambulatory Visit | Attending: Urology | Admitting: Urology

## 2022-09-01 ENCOUNTER — Encounter (HOSPITAL_COMMUNITY): Payer: Self-pay

## 2022-09-01 DIAGNOSIS — E669 Obesity, unspecified: Secondary | ICD-10-CM | POA: Insufficient documentation

## 2022-09-01 DIAGNOSIS — F17200 Nicotine dependence, unspecified, uncomplicated: Secondary | ICD-10-CM | POA: Insufficient documentation

## 2022-09-01 DIAGNOSIS — Z01812 Encounter for preprocedural laboratory examination: Secondary | ICD-10-CM | POA: Insufficient documentation

## 2022-09-01 HISTORY — DX: Other specified postprocedural states: Z98.890

## 2022-09-01 HISTORY — DX: Essential (primary) hypertension: I10

## 2022-09-02 ENCOUNTER — Ambulatory Visit (HOSPITAL_BASED_OUTPATIENT_CLINIC_OR_DEPARTMENT_OTHER): Payer: No Typology Code available for payment source | Admitting: Anesthesiology

## 2022-09-02 ENCOUNTER — Other Ambulatory Visit: Payer: Self-pay

## 2022-09-02 ENCOUNTER — Ambulatory Visit (HOSPITAL_COMMUNITY)
Admission: RE | Admit: 2022-09-02 | Discharge: 2022-09-02 | Disposition: A | Discharge status: Home / Self Care | Payer: No Typology Code available for payment source | Source: Ambulatory Visit | Attending: Urology | Admitting: Urology

## 2022-09-02 ENCOUNTER — Encounter (HOSPITAL_COMMUNITY)
Admission: RE | Disposition: A | Discharge status: Home / Self Care | Payer: Self-pay | Source: Ambulatory Visit | Attending: Urology

## 2022-09-02 ENCOUNTER — Ambulatory Visit (HOSPITAL_COMMUNITY): Payer: No Typology Code available for payment source | Admitting: Anesthesiology

## 2022-09-02 ENCOUNTER — Encounter (HOSPITAL_COMMUNITY): Payer: Self-pay | Admitting: Urology

## 2022-09-02 DIAGNOSIS — Z302 Encounter for sterilization: Secondary | ICD-10-CM

## 2022-09-02 DIAGNOSIS — K219 Gastro-esophageal reflux disease without esophagitis: Secondary | ICD-10-CM | POA: Diagnosis not present

## 2022-09-02 DIAGNOSIS — F17210 Nicotine dependence, cigarettes, uncomplicated: Secondary | ICD-10-CM | POA: Diagnosis not present

## 2022-09-02 DIAGNOSIS — I10 Essential (primary) hypertension: Secondary | ICD-10-CM | POA: Insufficient documentation

## 2022-09-02 HISTORY — PX: VASECTOMY: SHX75

## 2022-09-02 SURGERY — VASECTOMY
Anesthesia: General | Site: Scrotum | Laterality: Bilateral

## 2022-09-02 MED ORDER — MEPERIDINE HCL 50 MG/ML IJ SOLN: 6.2500 mg | INTRAMUSCULAR | Status: DC | PRN

## 2022-09-02 MED ORDER — PROPOFOL 10 MG/ML IV BOLUS
INTRAVENOUS | Status: DC | PRN
  Administered 2022-09-02: 340 mg via INTRAVENOUS

## 2022-09-02 MED ORDER — HYDROMORPHONE HCL 1 MG/ML IJ SOLN: 0.2500 mg | INTRAMUSCULAR | Status: DC | PRN

## 2022-09-02 MED ORDER — CEFAZOLIN IN SODIUM CHLORIDE 3-0.9 GM/100ML-% IV SOLN
3.0000 g | INTRAVENOUS | Status: AC
  Administered 2022-09-02: 3 g via INTRAVENOUS

## 2022-09-02 MED ORDER — BUPIVACAINE HCL (PF) 0.25 % IJ SOLN
INTRAMUSCULAR | Status: AC
  Filled 2022-09-02: qty 30

## 2022-09-02 MED ORDER — CHLORHEXIDINE GLUCONATE 0.12 % MT SOLN: 15.0000 mL | Freq: Once | OROMUCOSAL | Status: DC

## 2022-09-02 MED ORDER — DEXMEDETOMIDINE HCL IN NACL 80 MCG/20ML IV SOLN
INTRAVENOUS | Status: AC
  Filled 2022-09-02: qty 20

## 2022-09-02 MED ORDER — ONDANSETRON HCL 4 MG/2ML IJ SOLN
4.0000 mg | Freq: Once | INTRAMUSCULAR | Status: DC | PRN
Start: 2022-09-02 — End: 2022-09-02

## 2022-09-02 MED ORDER — LACTATED RINGERS IV SOLN: INTRAVENOUS | Status: DC

## 2022-09-02 MED ORDER — CEFAZOLIN SODIUM-DEXTROSE 2-4 GM/100ML-% IV SOLN
INTRAVENOUS | Status: AC
  Filled 2022-09-02: qty 100

## 2022-09-02 MED ORDER — TRAMADOL HCL 50 MG PO TABS: 50.0000 mg | ORAL_TABLET | Freq: Four times a day (QID) | ORAL | 0 refills | Status: DC | PRN

## 2022-09-02 MED ORDER — 0.9 % SODIUM CHLORIDE (POUR BTL) OPTIME
TOPICAL | Status: DC | PRN
  Administered 2022-09-02: 1000 mL

## 2022-09-02 MED ORDER — DEXMEDETOMIDINE (PRECEDEX) IN NS 20 MCG/5ML (4 MCG/ML) IV SYRINGE
PREFILLED_SYRINGE | INTRAVENOUS | Status: DC | PRN
  Administered 2022-09-02: 20 ug via INTRAVENOUS

## 2022-09-02 MED ORDER — LIDOCAINE HCL (CARDIAC) PF 100 MG/5ML IV SOSY
PREFILLED_SYRINGE | INTRAVENOUS | Status: DC | PRN
  Administered 2022-09-02: 100 mg via INTRAVENOUS

## 2022-09-02 MED ORDER — ONDANSETRON HCL 4 MG/2ML IJ SOLN
INTRAMUSCULAR | Status: DC | PRN
  Administered 2022-09-02: 4 mg via INTRAVENOUS

## 2022-09-02 MED ORDER — DEXAMETHASONE SODIUM PHOSPHATE 10 MG/ML IJ SOLN
INTRAMUSCULAR | Status: AC
  Filled 2022-09-02: qty 1

## 2022-09-02 MED ORDER — FENTANYL CITRATE (PF) 100 MCG/2ML IJ SOLN
INTRAMUSCULAR | Status: AC
  Filled 2022-09-02: qty 2

## 2022-09-02 MED ORDER — ONDANSETRON HCL 4 MG/2ML IJ SOLN
INTRAMUSCULAR | Status: AC
  Filled 2022-09-02: qty 2

## 2022-09-02 MED ORDER — FENTANYL CITRATE (PF) 100 MCG/2ML IJ SOLN
INTRAMUSCULAR | Status: DC | PRN
  Administered 2022-09-02: 50 ug via INTRAVENOUS

## 2022-09-02 MED ORDER — BUPIVACAINE HCL (PF) 0.25 % IJ SOLN
INTRAMUSCULAR | Status: DC | PRN
  Administered 2022-09-02: 6 mL

## 2022-09-02 MED ORDER — PROPOFOL 10 MG/ML IV BOLUS
INTRAVENOUS | Status: AC
  Filled 2022-09-02: qty 20

## 2022-09-02 MED ORDER — MIDAZOLAM HCL 2 MG/2ML IJ SOLN
INTRAMUSCULAR | Status: AC
  Filled 2022-09-02: qty 2

## 2022-09-02 MED ORDER — MIDAZOLAM HCL 5 MG/5ML IJ SOLN
INTRAMUSCULAR | Status: DC | PRN
  Administered 2022-09-02: 2 mg via INTRAVENOUS

## 2022-09-02 MED ORDER — DEXAMETHASONE SODIUM PHOSPHATE 10 MG/ML IJ SOLN
INTRAMUSCULAR | Status: DC | PRN
  Administered 2022-09-02: 10 mg via INTRAVENOUS

## 2022-09-02 MED ORDER — BUPIVACAINE-EPINEPHRINE (PF) 0.25% -1:200000 IJ SOLN
INTRAMUSCULAR | Status: AC
  Filled 2022-09-02: qty 30

## 2022-09-02 MED ORDER — ORAL CARE MOUTH RINSE: 15.0000 mL | Freq: Once | OROMUCOSAL | Status: DC

## 2022-09-02 SURGICAL SUPPLY — 28 items
BLADE SURG 15 STRL LF DISP TIS (BLADE) ×1 IMPLANT
BLADE SURG 15 STRL SS (BLADE) ×1
BNDG GAUZE ELAST 4 BULKY (GAUZE/BANDAGES/DRESSINGS) ×1 IMPLANT
CLOTH BEACON ORANGE TIMEOUT ST (SAFETY) ×1 IMPLANT
COVER LIGHT HANDLE STERIS (MISCELLANEOUS) ×1 IMPLANT
DECANTER SPIKE VIAL GLASS SM (MISCELLANEOUS) ×1 IMPLANT
ELECT NDL BLADE 2-5/6 (NEEDLE) ×1 IMPLANT
ELECT NEEDLE BLADE 2-5/6 (NEEDLE) ×1 IMPLANT
ELECT REM PT RETURN 9FT ADLT (ELECTROSURGICAL) ×1
ELECTRODE REM PT RTRN 9FT ADLT (ELECTROSURGICAL) ×1 IMPLANT
GAUZE SPONGE 4X4 12PLY STRL (GAUZE/BANDAGES/DRESSINGS) ×1 IMPLANT
GLOVE BIO SURGEON STRL SZ8 (GLOVE) ×1 IMPLANT
GLOVE BIOGEL PI IND STRL 7.0 (GLOVE) ×2 IMPLANT
GOWN STRL REUS W/TWL LRG LVL3 (GOWN DISPOSABLE) ×1 IMPLANT
GOWN STRL REUS W/TWL XL LVL3 (GOWN DISPOSABLE) ×1 IMPLANT
KIT TURNOVER KIT A (KITS) ×1 IMPLANT
MANIFOLD NEPTUNE II (INSTRUMENTS) ×1 IMPLANT
NDL HYPO 25X1 1.5 SAFETY (NEEDLE) ×1 IMPLANT
NEEDLE HYPO 25X1 1.5 SAFETY (NEEDLE) ×1 IMPLANT
NS IRRIG 500ML POUR BTL (IV SOLUTION) ×1 IMPLANT
PACK MINOR (CUSTOM PROCEDURE TRAY) ×1 IMPLANT
PAD ARMBOARD 7.5X6 YLW CONV (MISCELLANEOUS) ×1 IMPLANT
SET BASIN LINEN APH (SET/KITS/TRAYS/PACK) ×1 IMPLANT
SUPPORT SCROTAL XLRG NO STRP (MISCELLANEOUS) IMPLANT
SUT CHROMIC 3 0 PS 2 (SUTURE) ×1 IMPLANT
SUT SILK 2 0 (SUTURE) ×1
SUT SILK 2-0 18XBRD TIE 12 (SUTURE) ×1 IMPLANT
SYR CONTROL 10ML LL (SYRINGE) ×1 IMPLANT

## 2022-09-02 NOTE — Anesthesia Postprocedure Evaluation (Signed)
Anesthesia Post Note  Patient: Garrett Lee  Procedure(s) Performed: VASECTOMY (Bilateral: Scrotum)  Patient location during evaluation: Phase II Anesthesia Type: General Level of consciousness: awake and alert and oriented Pain management: pain level controlled Vital Signs Assessment: post-procedure vital signs reviewed and stable Respiratory status: spontaneous breathing, nonlabored ventilation and respiratory function stable Cardiovascular status: blood pressure returned to baseline and stable Postop Assessment: no apparent nausea or vomiting Anesthetic complications: no   No notable events documented.   Last Vitals:  Vitals:   09/02/22 1008 09/02/22 1011  BP:  129/72  Pulse:    Resp: (!) 21   Temp:  36.6 C  SpO2:  95%    Last Pain:  Vitals:   09/02/22 1008  PainSc: 0-No pain                 Tipton Ballow C Alesa Echevarria

## 2022-09-02 NOTE — H&P (Signed)
Urology Admission H&P  Chief Complaint: desires sterilization  History of Present Illness: Garrett Lee is a 26yo here for vasectomy. No history of scrotal surgeries  Past Medical History:  Diagnosis Date   Hypertension    PONV (postoperative nausea and vomiting)    Past Surgical History:  Procedure Laterality Date   FIBULA FRACTURE SURGERY     SHOULDER SURGERY     TONSILLECTOMY     tubes in ears      Home Medications:  Current Facility-Administered Medications  Medication Dose Route Frequency Provider Last Rate Last Admin   ceFAZolin (ANCEF) 2-4 GM/100ML-% IVPB            ceFAZolin (ANCEF) IVPB 3g/100 mL premix  3 g Intravenous 30 min Pre-Op Tylon Kemmerling, Mardene Celeste, MD       chlorhexidine (PERIDEX) 0.12 % solution 15 mL  15 mL Mouth/Throat Once Molli Barrows, MD       Or   Oral care mouth rinse  15 mL Mouth Rinse Once Battula, Rajamani C, MD       lactated ringers infusion   Intravenous Continuous Battula, Rajamani C, MD       Allergies: No Known Allergies  History reviewed. No pertinent family history. Social History:  reports that he has been smoking. He uses smokeless tobacco. He reports that he does not drink alcohol and does not use drugs.  Review of Systems  All other systems reviewed and are negative.   Physical Exam:  Vital signs in last 24 hours:   Physical Exam Vitals reviewed.  Constitutional:      Appearance: Normal appearance.  HENT:     Head: Normocephalic and atraumatic.     Mouth/Throat:     Mouth: Mucous membranes are dry.  Eyes:     Extraocular Movements: Extraocular movements intact.     Pupils: Pupils are equal, round, and reactive to light.  Cardiovascular:     Rate and Rhythm: Normal rate and regular rhythm.  Pulmonary:     Effort: Pulmonary effort is normal. No respiratory distress.  Abdominal:     General: Abdomen is flat. There is no distension.  Musculoskeletal:        General: No swelling. Normal range of motion.     Cervical  back: Normal range of motion and neck supple.  Skin:    General: Skin is warm and dry.  Neurological:     General: No focal deficit present.     Mental Status: He is alert and oriented to person, place, and time.  Psychiatric:        Mood and Affect: Mood normal.        Behavior: Behavior normal.        Thought Content: Thought content normal.        Judgment: Judgment normal.     Laboratory Data:  No results found for this or any previous visit (from the past 24 hour(s)). No results found for this or any previous visit (from the past 240 hour(s)). Creatinine: No results for input(s): "CREATININE" in the last 168 hours. Baseline Creatinine:   Impression/Assessment:  26yo who desires sterilization  Plan:  The risks/benefits/alternatives to vasectomy was explained to the patient and he understands and wishes to proceed with surgery  [name] 09/02/2022, 8:41 AM

## 2022-09-02 NOTE — Op Note (Signed)
Preoperative diagnosis: Desires Sterilization Postop diagnosis: Same  Procedure: 1.  Vasectomy  Attending: [name]  Anesthesia: General  Estimated blood loss: 5 cc  Drains: 1. none  Specimens: bilateral cross section of vas deferens  Antibiotics: none  Findings: thickened spermatic cord bilaterally  Indications: Patient is a 26 year old who desires sterilization. After discussing options he desires to proceed with vasectomy.  Procedure in detail: Prior to procedure consent was obtained. Patient was brought to the operating room and briefing was done sure correct patient, correct procedure, correct site.  General anesthesia was in administered patient was placed in the supine position.  The patients genetalia was prepped and draped in the usual, sterile fashion.  A 0.5 cm incision was made in the left hemiscrotum.  The vas was then brought to the level of the incision.  Then using a vas grasper we isolated the vas deferens.  We then used electrocautery to free the vas from the perivasal tissue.  Once the vas was isolated we then placed a clamp on either side of the vas.  Then sharply incised the vas and removed a 1 cm section of vas deferens.  We then cauterized the ends of the vein as and then ligated the vessels with 0 silk suture.  We then placed the individual limbs of vas deferens and separate compartments.  We closed the overlying skin with 3-0 chromic in interrupted fashion.  A similar technique was used on the left side to isolate the left vas.  Left vas was ligated in a similar fashion.  Good hemostasis was also noted in the left hemiscrotum.  We then returned the ends of the vas to separate compartments.  We then closed the overlying skin with 3-0 chromic in an interrupted fashion. This then concluded the procedure which was well tolerated by the patient. Complications: None Condition: Stable, x-rayed, transferred to PACU. Plan: Patient is to be discharged home and followup  in 12 weeks with a semen sample

## 2022-09-02 NOTE — Transfer of Care (Signed)
Immediate Anesthesia Transfer of Care Note  Patient: Garrett Lee  Procedure(s) Performed: VASECTOMY (Bilateral: Scrotum)  Patient Location: PACU  Anesthesia Type:General  Level of Consciousness: awake, alert  and patient cooperative  Airway & Oxygen Therapy: Patient Spontanous Breathing  Post-op Assessment: Report given to RN, Post -op Vital signs reviewed and stable and Patient moving all extremities X 4  Post vital signs: Reviewed and stable  Last Vitals:  Vitals Value Taken Time  BP 112/80   Temp    Pulse 80   Resp 20   SpO2 94     Last Pain: There were no vitals filed for this visit.       Complications: No notable events documented.

## 2022-09-02 NOTE — Anesthesia Preprocedure Evaluation (Signed)
Anesthesia Evaluation  Patient identified by MRN, date of birth, ID band Patient awake    Reviewed: Allergy & Precautions, NPO status , Patient's Chart, lab work & pertinent test results  History of Anesthesia Complications (+) PONV and history of anesthetic complications  Airway Mallampati: III  TM Distance: >3 FB Neck ROM: Full    Dental  (+) Dental Advisory Given, Teeth Intact   Pulmonary Current Smoker and Patient abstained from smoking.,    Pulmonary exam normal breath sounds clear to auscultation       Cardiovascular Exercise Tolerance: Good hypertension, Pt. on medications Normal cardiovascular exam Rhythm:Regular Rate:Normal     Neuro/Psych negative neurological ROS  negative psych ROS   GI/Hepatic Neg liver ROS, GERD (takes tums, zantac occasionally)  Controlled and Medicated,  Endo/Other  Morbid obesity  Renal/GU negative Renal ROS  negative genitourinary   Musculoskeletal negative musculoskeletal ROS (+)   Abdominal   Peds negative pediatric ROS (+)  Hematology negative hematology ROS (+)   Anesthesia Other Findings   Reproductive/Obstetrics negative OB ROS                            Anesthesia Physical Anesthesia Plan  ASA: 3  Anesthesia Plan: General   Post-op Pain Management: Dilaudid IV   Induction: Intravenous  PONV Risk Score and Plan: 4 or greater and Ondansetron and Dexamethasone  Airway Management Planned: LMA  Additional Equipment:   Intra-op Plan:   Post-operative Plan: Extubation in OR  Informed Consent: I have reviewed the patients History and Physical, chart, labs and discussed the procedure including the risks, benefits and alternatives for the proposed anesthesia with the patient or authorized representative who has indicated his/her understanding and acceptance.     Dental advisory given  Plan Discussed with: CRNA and Surgeon  Anesthesia  Plan Comments:        Anesthesia Quick Evaluation

## 2022-09-02 NOTE — Anesthesia Procedure Notes (Signed)
Procedure Name: LMA Insertion Date/Time: 09/02/2022 9:14 AM  Performed by: Shanon Payor, CRNAPre-anesthesia Checklist: Patient identified, Emergency Drugs available, Suction available and Patient being monitored Patient Re-evaluated:Patient Re-evaluated prior to induction Oxygen Delivery Method: Circle system utilized Preoxygenation: Pre-oxygenation with 100% oxygen Induction Type: IV induction Ventilation: Mask ventilation without difficulty LMA: LMA inserted LMA Size: 4.0 Tube type: Oral Number of attempts: 1 Placement Confirmation: positive ETCO2 and breath sounds checked- equal and bilateral Tube secured with: Tape Dental Injury: Teeth and Oropharynx as per pre-operative assessment

## 2022-09-03 LAB — SURGICAL PATHOLOGY

## 2022-09-07 ENCOUNTER — Encounter (HOSPITAL_COMMUNITY): Payer: Self-pay | Admitting: Urology

## 2022-10-14 ENCOUNTER — Ambulatory Visit: Payer: No Typology Code available for payment source | Admitting: Nurse Practitioner

## 2022-10-14 ENCOUNTER — Ambulatory Visit (INDEPENDENT_AMBULATORY_CARE_PROVIDER_SITE_OTHER): Payer: No Typology Code available for payment source | Admitting: Family Medicine

## 2022-10-14 VITALS — BP 168/96 | HR 88 | Temp 98.1°F | Ht 69.25 in | Wt 271.0 lb

## 2022-10-14 DIAGNOSIS — E119 Type 2 diabetes mellitus without complications: Secondary | ICD-10-CM | POA: Insufficient documentation

## 2022-10-14 DIAGNOSIS — E785 Hyperlipidemia, unspecified: Secondary | ICD-10-CM

## 2022-10-14 DIAGNOSIS — K760 Fatty (change of) liver, not elsewhere classified: Secondary | ICD-10-CM

## 2022-10-14 DIAGNOSIS — E1169 Type 2 diabetes mellitus with other specified complication: Secondary | ICD-10-CM | POA: Diagnosis not present

## 2022-10-14 MED ORDER — METFORMIN HCL 500 MG PO TABS: 500.0000 mg | ORAL_TABLET | Freq: Two times a day (BID) | ORAL | 1 refills | Status: DC

## 2022-10-14 MED ORDER — VALSARTAN 160 MG PO TABS: 160.0000 mg | ORAL_TABLET | Freq: Every day | ORAL | 3 refills | Status: DC

## 2022-10-14 NOTE — Progress Notes (Signed)
   Subjective:    Patient ID: Garrett Lee, male    DOB: [birth year], 26 y.o.   MRN: [id number]  HPI Requesting HBP meds due to employer putting him out of work due to A1c and BP elevated Patient presents today he went for his work physical his sugar elevated blood pressure elevated he states his weight has been gone up he is trying to eat healthier trying to minimize salt he did Does smoke he knows he needs to quit We did talk about annual eye exam and foot exam he denies any chest pressure tightness pain Review of Systems     Objective:   Physical Exam General-in no acute distress Eyes-no discharge Lungs-respiratory rate normal, CTA CV-no murmurs,RRR Extremities skin warm dry no edema Neuro grossly normal Behavior normal, alert  Patient states blood pressure at home is much better than what it is in the office      Assessment & Plan:  1. Hyperlipidemia associated with type 2 diabetes mellitus ([place]) On follow-up we will discuss statin-do not want to throw too much at him at 1 time  2. Type 2 diabetes mellitus without complication, without long-term current use of insulin (HCC) Start metformin twice daily side effects discussed healthy diet recommended information printed and given  3. Fatty liver Avoid alcohol regular physical activity healthy diet minimize sugar starches start medication Reach labs again in several months  4. Morbid obesity ([place]) Portion control regular physical activity  HTN poor control recommend valsartan

## 2022-10-15 LAB — HEPATIC FUNCTION PANEL
ALT: 129 IU/L — ABNORMAL HIGH (ref 0–44)
AST: 63 IU/L — ABNORMAL HIGH (ref 0–40)
Albumin: 4.8 g/dL (ref 4.3–5.2)
Alkaline Phosphatase: 138 IU/L — ABNORMAL HIGH (ref 44–121)
Bilirubin Total: 0.3 mg/dL (ref 0.0–1.2)
Bilirubin, Direct: 0.11 mg/dL (ref 0.00–0.40)
Total Protein: 6.9 g/dL (ref 6.0–8.5)

## 2022-10-15 LAB — COMPREHENSIVE METABOLIC PANEL
ALT: 129 IU/L — ABNORMAL HIGH (ref 0–44)
AST: 59 IU/L — ABNORMAL HIGH (ref 0–40)
Albumin/Globulin Ratio: 2.5 — ABNORMAL HIGH (ref 1.2–2.2)
Albumin: 4.8 g/dL (ref 4.3–5.2)
Alkaline Phosphatase: 142 IU/L — ABNORMAL HIGH (ref 44–121)
BUN/Creatinine Ratio: 15 (ref 9–20)
BUN: 12 mg/dL (ref 6–20)
Bilirubin Total: 0.3 mg/dL (ref 0.0–1.2)
CO2: 22 mmol/L (ref 20–29)
Calcium: 9.8 mg/dL (ref 8.7–10.2)
Chloride: 101 mmol/L (ref 96–106)
Creatinine, Ser: 0.79 mg/dL (ref 0.76–1.27)
Globulin, Total: 1.9 g/dL (ref 1.5–4.5)
Glucose: 174 mg/dL — ABNORMAL HIGH (ref 70–99)
Potassium: 4.7 mmol/L (ref 3.5–5.2)
Sodium: 140 mmol/L (ref 134–144)
Total Protein: 6.7 g/dL (ref 6.0–8.5)
eGFR: 124 mL/min/{1.73_m2} (ref >59)

## 2022-10-15 LAB — IRON,TIBC AND FERRITIN PANEL
Ferritin: 726 ng/mL — ABNORMAL HIGH (ref 30–400)
Iron Saturation: 29 % (ref 15–55)
Iron: 101 ug/dL (ref 38–169)
Total Iron Binding Capacity: 345 ug/dL (ref 250–450)
UIBC: 244 ug/dL (ref 111–343)

## 2022-10-15 LAB — ANTI-SMOOTH MUSCLE ANTIBODY, IGG: Smooth Muscle Ab: 3 Units (ref 0–19)

## 2022-10-15 LAB — TSH+FREE T4
Free T4: 1.18 ng/dL (ref 0.82–1.77)
TSH: 1.18 u[IU]/mL (ref 0.450–4.500)

## 2022-10-15 LAB — HEMOGLOBIN A1C
Est. average glucose Bld gHb Est-mCnc: 229 mg/dL
Hgb A1c MFr Bld: 9.6 % — ABNORMAL HIGH (ref 4.8–5.6)

## 2022-10-15 LAB — LIPID PANEL
Chol/HDL Ratio: 7.7 ratio — ABNORMAL HIGH (ref 0.0–5.0)
Cholesterol, Total: 247 mg/dL — ABNORMAL HIGH (ref 100–199)
HDL: 32 mg/dL — ABNORMAL LOW (ref >39)
LDL Chol Calc (NIH): 134 mg/dL — ABNORMAL HIGH (ref 0–99)
Triglycerides: 442 mg/dL — ABNORMAL HIGH (ref 0–149)
VLDL Cholesterol Cal: 81 mg/dL — ABNORMAL HIGH (ref 5–40)

## 2022-10-15 LAB — ANA: Anti Nuclear Antibody (ANA): NEGATIVE

## 2022-10-15 LAB — FERRITIN: Ferritin: 678 ng/mL — ABNORMAL HIGH (ref 30–400)

## 2022-10-15 LAB — HEPATITIS B SURFACE ANTIGEN: Hepatitis B Surface Ag: NEGATIVE

## 2022-10-18 ENCOUNTER — Ambulatory Visit: Payer: No Typology Code available for payment source | Admitting: Family Medicine

## 2022-10-18 ENCOUNTER — Other Ambulatory Visit: Payer: Self-pay

## 2022-10-18 DIAGNOSIS — E119 Type 2 diabetes mellitus without complications: Secondary | ICD-10-CM

## 2022-10-18 DIAGNOSIS — Z79899 Other long term (current) drug therapy: Secondary | ICD-10-CM

## 2022-10-18 DIAGNOSIS — I10 Essential (primary) hypertension: Secondary | ICD-10-CM

## 2022-10-18 NOTE — Progress Notes (Signed)
Patient has been made aware per drs orders and recommendations.  

## 2022-10-19 ENCOUNTER — Ambulatory Visit: Payer: No Typology Code available for payment source | Admitting: Nurse Practitioner

## 2022-10-22 ENCOUNTER — Other Ambulatory Visit: Payer: Self-pay | Admitting: Family Medicine

## 2022-10-22 DIAGNOSIS — R7989 Other specified abnormal findings of blood chemistry: Secondary | ICD-10-CM

## 2022-10-22 DIAGNOSIS — R748 Abnormal levels of other serum enzymes: Secondary | ICD-10-CM

## 2022-10-26 ENCOUNTER — Encounter: Payer: Self-pay | Admitting: Gastroenterology

## 2022-10-30 LAB — MICROALBUMIN / CREATININE URINE RATIO
Creatinine, Urine: 105.5 mg/dL
Microalb/Creat Ratio: 32 mg/g creat — ABNORMAL HIGH (ref 0–29)
Microalbumin, Urine: 33.6 ug/mL

## 2022-10-30 LAB — BASIC METABOLIC PANEL
BUN/Creatinine Ratio: 16 (ref 9–20)
BUN: 12 mg/dL (ref 6–20)
CO2: 20 mmol/L (ref 20–29)
Calcium: 9.8 mg/dL (ref 8.7–10.2)
Chloride: 106 mmol/L (ref 96–106)
Creatinine, Ser: 0.76 mg/dL (ref 0.76–1.27)
Glucose: 127 mg/dL — ABNORMAL HIGH (ref 70–99)
Potassium: 4.4 mmol/L (ref 3.5–5.2)
Sodium: 141 mmol/L (ref 134–144)
eGFR: 126 mL/min/{1.73_m2} (ref >59)

## 2022-11-01 ENCOUNTER — Ambulatory Visit (INDEPENDENT_AMBULATORY_CARE_PROVIDER_SITE_OTHER): Payer: No Typology Code available for payment source | Admitting: Family Medicine

## 2022-11-01 ENCOUNTER — Encounter: Payer: Self-pay | Admitting: Family Medicine

## 2022-11-01 VITALS — BP 132/78 | Wt 268.0 lb

## 2022-11-01 DIAGNOSIS — E785 Hyperlipidemia, unspecified: Secondary | ICD-10-CM

## 2022-11-01 DIAGNOSIS — Z79899 Other long term (current) drug therapy: Secondary | ICD-10-CM

## 2022-11-01 DIAGNOSIS — E119 Type 2 diabetes mellitus without complications: Secondary | ICD-10-CM | POA: Diagnosis not present

## 2022-11-01 DIAGNOSIS — E1169 Type 2 diabetes mellitus with other specified complication: Secondary | ICD-10-CM

## 2022-11-01 NOTE — Patient Instructions (Signed)
As for the glucometer I would recommend checking your sugars occasionally.  There is no need to check your sugar super frequently morning sugars should be somewhere around 100-120 later in the day should be less than 160    Discussion of GLP-1 medications Please remember these medications are to assist with weight reduction and diabetes control.  They do not take the place of healthy eating and regular physical activity. There are several GLP-1 medications that can help with weight reduction and diabetes control.  GLP-1 medications with indication for diabetes-Trulicity, Victoza, Bydureon , Mounjaro, Ozempic, and Rybelsus (although these are injected medicines except for Rybelsus which is a pill) GLP-1 medications with indications for weight loss-Wegovy, and Saxenda  Mechanism of action  These medications stimulate glucagon peptide receptors.  By doing so it does the following:  1-slows down stomach emptying-essentially food takes longer to go through the stomach and the intestines-this can lessen appetite  2-reduces glucagon secretion from the pancreas-this helps keep blood glucose levels stable between meals  3-increase his insulin release from the pancreas-with diabetics this helps keep blood glucose levels stable  4-promotes the feeling of being full in the brain-encouraged him receptors in the brain received the signal so the brain and body know that it is time to stop eating Benefits of the medication  1-reduced weight-reduction of weight is more significant at higher dosing.  Not as much weight reduction with Rybelsus   A- typically Wegovy at higher doses can assist with significant weight reduction.  2-improved blood glucose levels-with diabetics typically we see a significant drop with A1c when the medication is adjusted appropriately.  3-decrease risk of heart attacks and strokes-individuals with type 2 diabetes are at increased risk of heart attacks and strokes.  These GLP-1  medications can reduce the risk by 22%.  Risk of GLP-1 medications-these are some of the risks.  It is important to talk with your provider in a shared discussion before starting any medication.  Contraindications to GLP-1 medications-in other words who should not take these.  1-individuals with history of thyroid medullary cancer  2-individuals with family history of multiple endocrine neoplasm syndrome type II (MEN 2)  3-individuals with a history of pancreatitis  4-those with a history of severe hypersensitivity or allergy reactions to GLP-1 medications  5-should be avoided in individuals with a history of suicidal attempts or active suicidal ideation.  Cautions include- 1- risk of thyroid C-cell tumors were seen in rats during clinical testing in humans the relevancy of this information has not been determined 2-risk of pancreatitis including fatal and nonfatal hemorrhagic or necrotizing pancreatitis 3-gallbladder disease slightly increased risk of gallstones 4-hypoglycemia-more common with individuals who are on insulin or sulfonylurea such as glipizide 5-acute kidney injury or worsening of chronic renal failure often this is triggered by severe vomiting and diarrhea as side effects to GLP-1. 6-slightly increased risk of diabetic retinopathy complications in patients with type 2 diabetes 7-heart rate increase-slight increase of base heart rate with GLP-1 8-suicidal behavior and ideation has been reported in clinical trials with other weight loss medicines.  If depression occurs with medication or suicidal ideation stop medication immediately notify provider or seek help.  Common side effects include nausea, vomiting, diarrhea, constipation and increased heart rate.  Less common side effects severe abdominal pain-if this occurs notify provider stop medication get tested for pancreatitis. Full discussion with your provider should occur before starting these medicines.

## 2022-11-01 NOTE — Progress Notes (Signed)
   It is a aiming keeping busy helps her  Subjective:    Patient ID: Garrett Lee, male    DOB: 1996-05-09, 26 y.o.   MRN: [id number]  Diabetes He presents for his follow-up diabetic visit. He has type 2 diabetes mellitus. There are no hypoglycemic associated symptoms. There are no diabetic associated symptoms. There are no hypoglycemic complications. There are no diabetic complications. He does not see a podiatrist.Eye exam is not current.  Hypertension This is a chronic problem. There are no compliance problems.    Pt states he has nothing to check blood sugars with.    Review of Systems     Objective:   Physical Exam  General-in no acute distress Eyes-no discharge Lungs-respiratory rate normal, CTA CV-no murmurs,RRR Extremities skin warm dry no edema Neuro grossly normal Behavior normal, alert       Assessment & Plan:  1. Hyperlipidemia associated with type 2 diabetes mellitus ([place]) Continue healthy diet check lab work before next visit more than likely have to add a statin - Lipid panel - Basic metabolic panel - Hepatic function panel - Hemoglobin A1c  2. Type 2 diabetes mellitus without complication, without long-term current use of insulin (HCC) Bump up dose of metformin twice daily.  Healthy diet.  Portion control.  A1c before next visit if not in the best range and GLP-1 medicine - Lipid panel - Basic metabolic panel - Hepatic function panel - Hemoglobin A1c  3. High risk medication use Labs before next visit - Lipid panel - Basic metabolic panel - Hepatic function panel - Hemoglobin A1c  Blood pressure good control Fatty liver weight loss recommended If not seeing significant improvement with the lab work before next visit recommend GLP-1 medicines to help with weight loss  Has GI visit coming up

## 2022-11-08 ENCOUNTER — Ambulatory Visit: Payer: No Typology Code available for payment source | Admitting: Family Medicine

## 2022-11-24 NOTE — Progress Notes (Unsigned)
GI Office Note    Referring Provider: Kathyrn Drown, MD Primary Care Physician:  Kathyrn Drown, MD  Primary Gastroenterologist: Elon Alas. Abbey Chatters, DO  Chief Complaint   Chief Complaint  Patient presents with   Fatty liver     History of Present Illness   Garrett Lee is a 26 y.o. male presenting today at the request of Luking, Elayne Snare, MD for elevated LFTs and elevated ferritin.  Right upper quadrant ultrasound in July 2023 with hepatomegaly and evidence of hepatic steatosis (increased echogenicity of parenchyma).  Normal CBD.  Patent portal vein.  Most recently seen by PCP 10/14/2022.  Negative hyperlipidemia, elevated BP, type 2 diabetes, fatty liver.  Will start on metformin daily.  Recommend to begin valsartan for hypertension.  Good diet and exercise regarding obesity and fatty liver.  Also avoid alcohol.  Plan to recheck labs in a few months.  Labs 10/14/2022: TSH normal.  Negative hep B surface antigen.  Alk phos 138, AST 63, ALT 129, T. bili 0.3.  Elevated total cholesterol and triglycerides.  Low HDL and elevated LDL.  Ferritin 678 (previously 894).  Normal iron.  ASMA negative, ANA negative.  A1c 9.6.  Hemoglobin 16.4  Most recent visit with PCP 11/01/2022.  Advised to increase metformin to twice daily, follow healthy diet with good portion control.  Plan to recheck A1c at next visit and start GLP-1 if not in good range.  Lipid panel, BMP, HFP, and A1c ordered.  Plan to add a statin with lipid panel nonapproved prior to next visit.  Advised to obtain a glucometer to check blood sugars.  Admit patient had elevated AST and ALT at the end of January and elevated ALT dating back 7-8 years ago.  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    Today: History of IV drug use: No.  History of intranasal drug use: pills in high school Tattoos: one, 8 years ago  Denies any history of hemochromatosis. 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 Family history of diabetes. Denies abdominal pain,, constipation, diarrhea, lack of appetite, early satiety, weight loss, melena, BRBPR.  Takes tums or rolaids over the counter at times for reflux. Has occasional nausea and vomiting related to this. Easier to eat healthier at work then at home due to family life at home.    Current Outpatient Medications  Medication Sig Dispense Refill   metFORMIN (GLUCOPHAGE) 500 MG tablet Take 1 tablet (500 mg total) by mouth 2 (two) times daily with a meal. 180 tablet 1   valsartan (DIOVAN) 160 MG tablet Take 1 tablet (160 mg total) by mouth daily. 90 tablet 3   No current facility-administered medications for this visit.    Past Medical History:  Diagnosis Date   Hypertension    PONV (postoperative nausea and vomiting)     Past Surgical History:  Procedure Laterality Date   FIBULA FRACTURE SURGERY     SHOULDER SURGERY     TONSILLECTOMY     tubes in ears     VASECTOMY Bilateral 09/02/2022   Procedure: VASECTOMY;  Surgeon: Cleon Gustin, MD;  Location: [facility];  Service: Urology;  Laterality: Bilateral;    Family History  Problem Relation Age of Onset   Diabetes Mother    Diabetes type I Maternal Grandmother    Heart disease Maternal Grandmother    Heart disease Maternal Grandfather    Colon cancer Paternal Grandfather  60 - 69   Colon polyps Neg Hx     Allergies as of 11/25/2022   (No Known Allergies)    Social History   Socioeconomic History   Marital status: Married    Spouse name: Not on file   Number of children: Not on file   Years of education: Not on file   Highest education level: Not on file  Occupational History   Not on file  Tobacco Use   Smoking status: Every Day    Packs/day: 0.50    Types: Cigarettes    Start date: 2010   Smokeless tobacco: Current  Substance and Sexual Activity   Alcohol use: Yes    Comment: once per month.   Drug use: No   Sexual activity: Not on file  Other Topics  Concern   Not on file  Social History Narrative   Not on file   Social Determinants of Health   Financial Resource Strain: Not on file  Food Insecurity: Not on file  Transportation Needs: Not on file  Physical Activity: Not on file  Stress: Not on file  Social Connections: Not on file  Intimate Partner Violence: Not on file     Review of Systems   Gen: Denies any fever, chills, fatigue, weight loss, lack of appetite.  CV: Denies chest pain, heart palpitations, peripheral edema, syncope.  Resp: Denies shortness of breath at rest or with exertion. Denies wheezing or cough.  GI: see HPI GU : Denies urinary burning, urinary frequency, urinary hesitancy MS: Denies joint pain, muscle weakness, cramps, or limitation of movement.  Derm: Denies rash, itching, dry skin Psych: Denies depression, anxiety, memory loss, and confusion Heme: Denies bruising, bleeding, and enlarged lymph nodes.   Physical Exam   BP 134/84 (BP Location: Left Arm, Patient Position: Sitting, Cuff Size: Large)   Pulse 93   Temp 97.9 F (36.6 C) (Temporal)   Ht _0  (1.803 m)   Wt 270 lb 12.8 oz (122.8 kg)   SpO2 97%   BMI 37.77 kg/m   General:   Alert and oriented. Pleasant and cooperative. Well-nourished and well-developed.  Head:  Normocephalic and atraumatic. Eyes:  Without icterus, sclera clear and conjunctiva pink.  Ears:  Normal auditory acuity. Mouth:  No deformity or lesions, oral mucosa pink.  Lungs:  Clear to auscultation bilaterally. No wheezes, rales, or rhonchi. No distress.  Heart:  S1, S2 present without murmurs appreciated.  Abdomen:  +BS, soft, non-tender and non-distended. Hepatomegaly present, upper left lobe palpated at epigastrium. No guarding or rebound. No masses appreciated.  Rectal:  Deferred Msk:  Symmetrical without gross deformities. Normal posture. Extremities:  Without edema. Neurologic:  Alert and  oriented x4;  grossly normal neurologically. Skin:  Intact without  significant lesions or rashes. Psych:  Alert and cooperative. Normal mood and affect.   Assessment   Garrett Lee is a 26 y.o. male with newly diagnosed hypertension and diabetes presenting today for evaluation of elevated LFTs and elevated ferritin.  Elevated LFTs, elevated ferritin: Mixed hepatocellular and cholestatic injury pattern.  Suspect elevated LFTs likely in the setting of nonalcoholic fatty liver disease given newly diagnosed diabetes.  Most recent A1c 9.3.  Currently on metformin twice daily.  Thus far workup is negative for active hepatitis B, normal ANA and ASMA.  Viral quadrant ultrasound revealing hepatomegaly and evidence of hepatic steatosis.  Ferritin currently elevated, has been elevated in the past.  Hemochromatosis remains in the differential.  Denies any family history  of hemochromatosis that he is aware of.  Suspect likely elevated LFTs secondary to metabolic dysfunction in the setting of uncontrolled diabetes.  Will rule out other causes including acute or chronic hepatitis, autoimmune hepatitis, and hemochromatosis.  Serologies ordered today.  Advised to avoid alcohol and follow fatty liver diet.  Also recommended regular exercise and weight loss.  GERD: Occasional symptoms likely secondary to dietary choices.  Will have some occasional nausea associated with symptoms and rare vomiting.  Typically feels better after vomiting and may take Tums or Rolaids as needed or will take nothing and let it resolve on its own.  Advised he may use Pepcid as needed over-the-counter in place of Tums if use becomes more frequent.  PLAN   Hepatitis B core antibody, hepatitis B surface antibody, hepatitis C antibody, hepatitis A antibody, antiliver kidney microsomal antibody, IgG, IgA, IgM, AMA, alpha-1 antitrypsin,hemochromatosis DNA. Avoid alcohol Fatty liver diet Avoid NSAIDs Pepcid as needed.  Exercise at least 15-30 minutes/day, at least 5 days/week. Adequate control blood  sugars Recommend 1-2# weight loss per week until ideal body weight  Follow-up to be determined after receiving lab work.  Venetia Night, MSN, FNP-BC, AGACNP-BC [facility]

## 2022-11-24 NOTE — Patient Instructions (Incomplete)
Instructions for fatty liver: Recommend 1-2# weight loss per week until ideal body weight through exercise & diet. Low fat/cholesterol diet.   Avoid sweets, sodas, fruit juices, sweetened beverages like tea, etc. Gradually increase exercise from 15 min daily up to 1 hr per day 5 days/week. Limit alcohol use.  Avoid NSAIDs.  For your reflux you may continue to take Tums or Rolaids as needed or if you would like longer-term relief, you may use famotidine or Pepcid once daily as needed.  I am ordering labs for you have completed at [facility].  A few of your labs may take more than a couple days to result.  Once I received all results we will reach out to you with any further recommendations regarding workup.  I would like to recheck your liver enzymes in about 2 months to see if they have improved or worsened with better control of your blood sugar.  No need to repeat them now as he just had them done within the last 4 weeks.  It was a pleasure to see you today. I want to create trusting relationships with patients. If you receive a survey regarding your visit,  I greatly appreciate you taking time to fill this out on paper or through your MyChart. I value your feedback.  Brooke Bonito, MSN, FNP-BC, AGACNP-BC [facility]

## 2022-11-25 ENCOUNTER — Encounter: Payer: Self-pay | Admitting: Gastroenterology

## 2022-11-25 ENCOUNTER — Ambulatory Visit (INDEPENDENT_AMBULATORY_CARE_PROVIDER_SITE_OTHER): Payer: No Typology Code available for payment source | Admitting: Gastroenterology

## 2022-11-25 VITALS — BP 134/84 | HR 93 | Temp 97.9°F | Ht 71.0 in | Wt 270.8 lb

## 2022-11-25 DIAGNOSIS — R7989 Other specified abnormal findings of blood chemistry: Secondary | ICD-10-CM | POA: Diagnosis not present

## 2022-12-01 LAB — IGG, IGA, IGM
IgA/Immunoglobulin A, Serum: 117 mg/dL (ref 90–386)
IgG (Immunoglobin G), Serum: 671 mg/dL (ref 603–1613)
IgM (Immunoglobulin M), Srm: 96 mg/dL (ref 20–172)

## 2022-12-01 LAB — HEPATITIS B CORE ANTIBODY, TOTAL: Hep B Core Total Ab: NEGATIVE

## 2022-12-01 LAB — HEPATITIS B SURFACE ANTIBODY,QUALITATIVE: Hep B Surface Ab, Qual: NONREACTIVE

## 2022-12-01 LAB — HEPATITIS A ANTIBODY, IGM: Hep A IgM: NEGATIVE

## 2022-12-01 LAB — ANTI-MICROSOMAL ANTIBODY LIVER / KIDNEY: LKM1 Ab: 0.9 Units (ref 0.0–20.0)

## 2022-12-01 LAB — MITOCHONDRIAL ANTIBODIES: Mitochondrial Ab: <20 Units (ref 0.0–20.0)

## 2022-12-01 LAB — ALPHA-1-ANTITRYPSIN: A-1 Antitrypsin: 130 mg/dL (ref 95–164)

## 2022-12-01 LAB — HEPATITIS C ANTIBODY: Hep C Virus Ab: NONREACTIVE

## 2022-12-01 LAB — HEPATITIS A ANTIBODY, TOTAL: hep A Total Ab: POSITIVE — AB

## 2022-12-01 LAB — HEMOCHROMATOSIS DNA-PCR(C282Y,H63D)

## 2022-12-03 ENCOUNTER — Encounter: Payer: Self-pay | Admitting: *Deleted

## 2023-01-04 ENCOUNTER — Encounter: Payer: Self-pay | Admitting: Urology

## 2023-01-12 ENCOUNTER — Other Ambulatory Visit: Payer: Self-pay

## 2023-01-13 ENCOUNTER — Other Ambulatory Visit: Payer: Self-pay | Admitting: Urology

## 2023-01-14 LAB — POST-VAS SPERM EVALUATION,QUAL: Volume: 8.2 mL

## 2023-01-14 LAB — SPECIMEN STATUS REPORT

## 2023-01-21 ENCOUNTER — Ambulatory Visit (INDEPENDENT_AMBULATORY_CARE_PROVIDER_SITE_OTHER): Payer: No Typology Code available for payment source | Admitting: Urology

## 2023-01-21 DIAGNOSIS — Z9852 Vasectomy status: Secondary | ICD-10-CM

## 2023-01-26 NOTE — Progress Notes (Signed)
Semen analysis showed no sperm

## 2023-01-29 LAB — LIPID PANEL
Chol/HDL Ratio: 8.3 ratio — ABNORMAL HIGH (ref 0.0–5.0)
Cholesterol, Total: 225 mg/dL — ABNORMAL HIGH (ref 100–199)
HDL: 27 mg/dL — ABNORMAL LOW (ref >39)
LDL Chol Calc (NIH): 140 mg/dL — ABNORMAL HIGH (ref 0–99)
Triglycerides: 319 mg/dL — ABNORMAL HIGH (ref 0–149)
VLDL Cholesterol Cal: 58 mg/dL — ABNORMAL HIGH (ref 5–40)

## 2023-01-29 LAB — BASIC METABOLIC PANEL
BUN/Creatinine Ratio: 23 — ABNORMAL HIGH (ref 9–20)
BUN: 19 mg/dL (ref 6–20)
CO2: 21 mmol/L (ref 20–29)
Calcium: 9.7 mg/dL (ref 8.7–10.2)
Chloride: 104 mmol/L (ref 96–106)
Creatinine, Ser: 0.82 mg/dL (ref 0.76–1.27)
Glucose: 115 mg/dL — ABNORMAL HIGH (ref 70–99)
Potassium: 4.3 mmol/L (ref 3.5–5.2)
Sodium: 141 mmol/L (ref 134–144)
eGFR: 122 mL/min/{1.73_m2} (ref >59)

## 2023-01-29 LAB — HEPATIC FUNCTION PANEL
ALT: 55 IU/L — ABNORMAL HIGH (ref 0–44)
AST: 22 IU/L (ref 0–40)
Albumin: 4.9 g/dL (ref 4.3–5.2)
Alkaline Phosphatase: 103 IU/L (ref 44–121)
Bilirubin Total: 0.3 mg/dL (ref 0.0–1.2)
Bilirubin, Direct: <0.1 mg/dL (ref 0.00–0.40)
Total Protein: 7.1 g/dL (ref 6.0–8.5)

## 2023-01-29 LAB — HEMOGLOBIN A1C
Est. average glucose Bld gHb Est-mCnc: 146 mg/dL
Hgb A1c MFr Bld: 6.7 % — ABNORMAL HIGH (ref 4.8–5.6)

## 2023-02-02 ENCOUNTER — Ambulatory Visit: Payer: No Typology Code available for payment source | Admitting: Family Medicine

## 2023-02-02 VITALS — BP 136/78 | Wt 264.2 lb

## 2023-02-02 DIAGNOSIS — R748 Abnormal levels of other serum enzymes: Secondary | ICD-10-CM | POA: Diagnosis not present

## 2023-02-02 DIAGNOSIS — E1169 Type 2 diabetes mellitus with other specified complication: Secondary | ICD-10-CM

## 2023-02-02 DIAGNOSIS — I10 Essential (primary) hypertension: Secondary | ICD-10-CM | POA: Diagnosis not present

## 2023-02-02 DIAGNOSIS — F439 Reaction to severe stress, unspecified: Secondary | ICD-10-CM | POA: Diagnosis not present

## 2023-02-02 DIAGNOSIS — E119 Type 2 diabetes mellitus without complications: Secondary | ICD-10-CM

## 2023-02-02 DIAGNOSIS — E785 Hyperlipidemia, unspecified: Secondary | ICD-10-CM

## 2023-02-02 MED ORDER — ESCITALOPRAM OXALATE 10 MG PO TABS: 10.0000 mg | ORAL_TABLET | Freq: Every day | ORAL | 1 refills | Status: DC

## 2023-02-02 MED ORDER — TRULICITY 0.75 MG/0.5ML ~~LOC~~ SOAJ: SUBCUTANEOUS | 1 refills | Status: DC

## 2023-02-02 MED ORDER — ROSUVASTATIN CALCIUM 10 MG PO TABS: 10.0000 mg | ORAL_TABLET | Freq: Every day | ORAL | 3 refills | Status: DC

## 2023-02-02 MED ORDER — METFORMIN HCL ER 500 MG PO TB24: ORAL_TABLET | ORAL | 1 refills | Status: DC

## 2023-02-02 NOTE — Progress Notes (Signed)
   Subjective:    Patient ID: GOKU HARB, male    DOB: February 28, 1996, 27 y.o.   MRN: [id number]  HPI  Patient arrives for 3 mouth follow up for lab work. Patient states no concerns or issues today. Patient under fair amount of stress His job is stressful He also has to take care of the children and his family is loving but nonetheless there is a lot of stress involved he finds himself feeling on edge a lot at times snappy and irritable.  He denies being depressed He does relate being anxious at times We did discuss medications he is interested in trying a serotonin reuptake inhibitor   Diabetes is doing better he is doing better with the diet he has been losing some weight but not a lot his A1c has come down he is not tolerating the metformin well it is causing diarrhea issues we will try extended release metformin if that still causes diarrhea issues we will stop it   Review of Systems     Objective:   Physical Exam  General-in no acute distress Eyes-no discharge Lungs-respiratory rate normal, CTA CV-no murmurs,RRR Extremities skin warm dry no edema Neuro grossly normal Behavior normal, alert       Assessment & Plan:  1. Type 2 diabetes mellitus without complication, without long-term current use of insulin (HCC) A1c is improved but he has diarrhea with the metformin we will try the extended release metformin to see if he will tolerate that better In addition to this I would recommend Trulicity to help with his diabetes and also would help with liver enzyme as well as his weight start off 0.75 he read over the information from the last visit and feels that this is a good choice for him he is aware of the potential side effects denies history of thyroid cancer.  He understands if he has severe nausea vomiting abdominal pain to stop medicine and notify us  Given his diabetes along with morbid obesity and increased risk of heart attacks and strokes as he gets older GLP-1 medicine  to help manage diabetes would be a good choice to help with this reducing the risk of heart attacks and strokes and reducing his weight   He is working hard on healthier eating and regular physical activity Patient understands the importance of quitting smoking but currently focusing in on other measures first 2. Elevated liver enzymes Improvement but nonetheless very important to continue to lose weight he is lost a few pounds but needs to lose 20-30 more  3. Hyperlipidemia associated with type 2 diabetes mellitus ([place]) Start low-dose statin we will recheck labs again in 3 months  4. Hypertension, unspecified type Blood pressure good control with medicine  5. Stress Severe stress related issues that are worsened by the stress of work and home denies being suicidal at times finds himself not enjoying things but denies being depressed other times he finds himself anxious stressed out by children and by other factors we did discuss stress relief techniques he does not feel that does enough we did discuss serotonin reuptake inhibitors and he is interested in trying 1 He will give [place] feedback within 3 weeks how he is doing with this He was warned that if he starts having severe depression or suicidal ideation stop medicine immediately and seek or help

## 2023-02-21 ENCOUNTER — Encounter: Payer: Self-pay | Admitting: Family Medicine

## 2023-02-21 ENCOUNTER — Other Ambulatory Visit: Payer: Self-pay

## 2023-02-21 DIAGNOSIS — E785 Hyperlipidemia, unspecified: Secondary | ICD-10-CM

## 2023-02-21 DIAGNOSIS — I10 Essential (primary) hypertension: Secondary | ICD-10-CM

## 2023-02-21 DIAGNOSIS — E119 Type 2 diabetes mellitus without complications: Secondary | ICD-10-CM

## 2023-02-21 MED ORDER — TRULICITY 1.5 MG/0.5ML ~~LOC~~ SOAJ: 1.5000 mg | SUBCUTANEOUS | 3 refills | Status: DC

## 2023-02-21 NOTE — Telephone Encounter (Signed)
Nurses Recommend increasing Trulicity to 1.5  May have 1 month supply with 3 refills He can give [place] an update in a month if doing well with this dose we can go up again To do CMP with estimated GFR, lipid, A1c, and urine ACR before next visit Keep follow-up visit in May  Continue Lexapro

## 2023-02-28 ENCOUNTER — Ambulatory Visit: Payer: No Typology Code available for payment source | Admitting: Gastroenterology

## 2023-03-14 ENCOUNTER — Ambulatory Visit (INDEPENDENT_AMBULATORY_CARE_PROVIDER_SITE_OTHER): Payer: No Typology Code available for payment source | Admitting: Gastroenterology

## 2023-03-14 ENCOUNTER — Encounter: Payer: Self-pay | Admitting: Gastroenterology

## 2023-03-14 VITALS — BP 135/81 | HR 70 | Temp 97.2°F | Ht 71.0 in | Wt 256.8 lb

## 2023-03-14 DIAGNOSIS — R7989 Other specified abnormal findings of blood chemistry: Secondary | ICD-10-CM | POA: Diagnosis not present

## 2023-03-14 DIAGNOSIS — K219 Gastro-esophageal reflux disease without esophagitis: Secondary | ICD-10-CM

## 2023-03-14 NOTE — Patient Instructions (Addendum)
Instructions for fatty liver: Recommend 1-2# weight loss per week until ideal body weight through exercise & diet. Low fat/cholesterol diet.   Avoid sweets, sodas, fruit juices, sweetened beverages like tea, etc. Gradually increase exercise from 15 min daily up to 1 hr per day 5 days/week. Limit alcohol use.  Use famotidine 20 mg as needed for breakthrough reflux symptoms.  Continue to maintain adequate control of blood sugars.  I would recommend that you be revaccinated for hepatitis B.  I would like for you to recheck your liver enzymes in 6 months.    It may be a good idea for you on your next blood drawl to recheck your ferritin as well to be sure this is coming down.  One of your hemochromatosis DNA strains were positive but this is not usually correlated to an increased risk of developing symptoms.  It was a pleasure to see you today. I want to create trusting relationships with patients. If you receive a survey regarding your visit,  I greatly appreciate you taking time to fill this out on paper or through your MyChart. I value your feedback.  Venetia Night, MSN, FNP-BC, AGACNP-BC [facility]

## 2023-03-14 NOTE — Progress Notes (Signed)
GI Office Note    Referring Provider: Kathyrn Drown, MD Primary Care Physician:  Kathyrn Drown, MD Primary Gastroenterologist: Elon Alas. Abbey Chatters, DO  Date:  03/14/2023  ID:  Garrett Lee, DOB [birth year], MRN [id number]   Chief Complaint   Chief Complaint  Patient presents with   Follow-up    Follow up on LFT's   History of Present Illness  Garrett Lee is a 27 y.o. male with a history of hypertension and diabetes presenting today for follow-up of elevated LFTs.  RUQ [place] in July 2023 with hepatomegaly and evidence of hepatic steatosis (increased echogenicity of parenchyma).  Normal CBD.  Patent portal vein.   Most recently seen by PCP 10/14/2022.  Negative hyperlipidemia, elevated BP, type 2 diabetes, fatty liver.  Will start on metformin daily.  Recommend to begin valsartan for hypertension.  Good diet and exercise regarding obesity and fatty liver.  Also avoid alcohol.  Plan to recheck labs in a few months.   Labs 10/14/2022: TSH normal.  Negative hep B surface antigen.  Alk phos 138, AST 63, ALT 129, T. bili 0.3.  Elevated total cholesterol and triglycerides.  Low HDL and elevated LDL.  Ferritin 678 (previously 894).  Normal iron.  ASMA negative, ANA negative.  A1c 9.6.  Hemoglobin 16.4   Most recent visit with PCP 11/01/2022.  Advised to increase metformin to twice daily, follow healthy diet with good portion control.  Plan to recheck A1c at next visit and start GLP-1 if not in good range.  Lipid panel, BMP, HFP, and A1c ordered.  Plan to add a statin with lipid panel nonapproved prior to next visit.  Advised to obtain a glucometer to check blood sugars.   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   Initial office visit 11/25/22.  Denied any history of IV drug use.  Did admit to taking unprescribed medications in high school.  Has 1-2 performed by years prior.  Denied any family history or personal history of hemochromatosis.  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  Does have family history of diabetes.  Denied any abdominal pain, constipation, diarrhea, lack appetite (headache and weight loss, melena, BRBPR.  Does have some occasional reflux and associated nausea or vomiting related to this.  States it is easier to eat healthier work than at home due to stress.  Viral hepatitis panel ordered as well as autoimmune serologies including immunoglobulins testing for hemochromatosis.  Advised on fatty liver diet and adequate control of blood sugars.  After lab work received advised patient to follow-up in 3 months.  Labs 11/25/22: Alpha-1-antitrpysin, AMA, ASMA, LKM1 Ab  negative.  Normal immunoglobulins no evidence of viral hepatitis. Not immune to Hep B. Hemochromatosis DNA c.187C>G (p.His63Asp) - Detected, heterozygous (Not associated with increased risk to develop clinical symptoms of Hereditary Hemochromatosis. In symptomatic individuals, other causes of iron overload should be evaluated.)  Labs 01/28/2023: Alk phos 103, AST 22, ALT 55, A1c6.7 (down from 9.6).  Total cholesterol 225, triglycerides 319, HDL 27, LDL 140.  Today: Has lost 15 pounds since his last visit. Has not been checking his blood sugar at home. Just started Trulicity just under 2 months ago. Was just on metformin prior to that. Still exercising. PCP checked blood work beginning of February. Due to have labs checked again May 8th.   No abdominal pain, nausea, vomiting negative appetite, satiety.  Reflux is okay. Hit or miss depends on his stress rather  than what he eats.    Current Outpatient Medications  Medication Sig Dispense Refill   Dulaglutide (TRULICITY) 1.5 0000000 SOPN Inject 1.5 mg into the skin once a week. 2 mL 3   escitalopram (LEXAPRO) 10 MG tablet Take 1 tablet (10 mg total) by mouth daily. 90 tablet 1   metFORMIN (GLUCOPHAGE-XR) 500 MG 24 hr tablet One daily 90 tablet 1   rosuvastatin (CRESTOR) 10 MG tablet Take 1 tablet (10 mg total) by  mouth daily. 90 tablet 3   valsartan (DIOVAN) 160 MG tablet Take 1 tablet (160 mg total) by mouth daily. 90 tablet 3   No current facility-administered medications for this visit.    Past Medical History:  Diagnosis Date   Hypertension    PONV (postoperative nausea and vomiting)     Past Surgical History:  Procedure Laterality Date   FIBULA FRACTURE SURGERY     SHOULDER SURGERY     TONSILLECTOMY     tubes in ears     VASECTOMY Bilateral 09/02/2022   Procedure: VASECTOMY;  Surgeon: Cleon Gustin, MD;  Location: [facility];  Service: Urology;  Laterality: Bilateral;    Family History  Problem Relation Age of Onset   Diabetes Mother    Diabetes type I Maternal Grandmother    Heart disease Maternal Grandmother    Heart disease Maternal Grandfather    Colon cancer Paternal Grandfather 42 - 69   Colon polyps Neg Hx     Allergies as of 03/14/2023   (No Known Allergies)    Social History   Socioeconomic History   Marital status: Married    Spouse name: Not on file   Number of children: Not on file   Years of education: Not on file   Highest education level: Not on file  Occupational History   Not on file  Tobacco Use   Smoking status: Every Day    Packs/day: .5    Types: Cigarettes    Start date: 2010   Smokeless tobacco: Current  Substance and Sexual Activity   Alcohol use: Yes    Comment: once per month.   Drug use: No   Sexual activity: Not on file  Other Topics Concern   Not on file  Social History Narrative   Not on file   Social Determinants of Health   Financial Resource Strain: Not on file  Food Insecurity: Not on file  Transportation Needs: Not on file  Physical Activity: Not on file  Stress: Not on file  Social Connections: Not on file     Review of Systems   Gen: Denies fever, chills, anorexia. Denies fatigue, weakness, weight loss.  CV: Denies chest pain, palpitations, syncope, peripheral edema, and claudication. Resp: Denies dyspnea at  rest, cough, wheezing, coughing up blood, and pleurisy. GI: See HPI Derm: Denies rash, itching, dry skin Psych: Denies depression, anxiety, memory loss, confusion. No homicidal or suicidal ideation.  Heme: Denies bruising, bleeding, and enlarged lymph nodes.   Physical Exam   BP 135/81   Pulse 70   Temp (!) 97.2 F (36.2 C)   Ht 5\' 11"  (1.803 m)   Wt 256 lb 12.8 oz (116.5 kg)   BMI 35.82 kg/m   General:   Alert and oriented. No distress noted. Pleasant and cooperative.  Head:  Normocephalic and atraumatic. Eyes:  Conjuctiva clear without scleral icterus. Mouth:  Oral mucosa pink and moist. Good dentition. No lesions. Abdomen:  +BS, soft, non-tender and non-distended. No rebound or guarding. No  HSM or masses noted. Rectal: deferred Msk:  Symmetrical without gross deformities. Normal posture. Extremities:  Without edema. Neurologic:  Alert and  oriented x4 Psych:  Alert and cooperative. Normal mood and affect.  Assessment  Garrett Lee is a 26 y.o. male with a history of hypertension diabetes presenting today for follow-up on GERD and elevated LFTs.  Elevated LFTs, elevated ferritin: Previous elevation in LFTs with mixed hepatocellular and cholestatic injury pattern.  RUQ [place] with hepatomegaly and evidence of hepatic steatosis in July 2023.  A1c previously elevated 9.6, has decreased down to 6.7.  Serologic workup negative for autoimmune etiologies, normal immunoglobulins, negative for viral hepatitis.  Ferritin is also been elevated with hemochromatosis DNA revealing heterozygous c.187C>G.  This is not typically associated with increased risk to develop clinical symptoms of hereditary hemochromatosis.  Currently appears to be asymptomatic.  He has lost 15 pounds since his last visit.  Most recent LFTs almost normalized with alk phos 103, AST 22, ALT 55 which is significant improvement from prior.  We will plan to recheck HFP in 6 months.  Also advised to complete hepatitis B  vaccination as he does not have immunity.  Will plan to recheck ferritin with his next blood draw.  Will reach out to PCP regarding performing this and checking HFP in 6 months.  GERD: Has occasional symptoms.  Does not take anything over-the-counter to help with this.  Denies any specific food triggers, feels as though increase stress causes worsening reflux for him.  Advised again that he may use Pepcid as needed.  PLAN   Hepatitis B vaccination needed.  Recheck ferritin with next blood draw -will reach out to Dr. Wolfgang Phoenix to perform this. Recommend 1-2# weight loss per week until ideal body weight through exercise & diet. Low fat/cholesterol diet. Avoid sweets, sodas, fruit juices, sweetened beverages like tea, etc. Gradually increase exercise from 15 min daily up to 1 hr per day 5 days/week. Limit alcohol use. Remain adequate to control blood sugars Repeat HFP in 6 months Avoid NSAIDs Pepcid as needed Follow-up in 6 months  Venetia Night, MSN, FNP-BC, AGACNP-BC [facility]

## 2023-03-16 ENCOUNTER — Telehealth: Payer: Self-pay | Admitting: *Deleted

## 2023-03-16 DIAGNOSIS — I10 Essential (primary) hypertension: Secondary | ICD-10-CM

## 2023-03-16 DIAGNOSIS — E1169 Type 2 diabetes mellitus with other specified complication: Secondary | ICD-10-CM

## 2023-03-16 DIAGNOSIS — E119 Type 2 diabetes mellitus without complications: Secondary | ICD-10-CM

## 2023-03-16 DIAGNOSIS — R748 Abnormal levels of other serum enzymes: Secondary | ICD-10-CM

## 2023-03-16 NOTE — Telephone Encounter (Signed)
Labs ordered in EPIC per Dr Bary Leriche request.

## 2023-03-16 NOTE — Telephone Encounter (Signed)
Garrett Drown, MD   Nurses-please add ferritin because of elevated liver enzymes.  Add this to the most recent order for all the other labs he will do these before his next office visit thank you

## 2023-03-20 ENCOUNTER — Encounter: Payer: Self-pay | Admitting: Family Medicine

## 2023-03-21 NOTE — Telephone Encounter (Signed)
Please go ahead and change Trulicity 3 mg once weekly 1 month supply with 3 refills

## 2023-03-23 MED ORDER — TRULICITY 3 MG/0.5ML ~~LOC~~ SOAJ: 3.0000 mg | SUBCUTANEOUS | 3 refills | Status: DC

## 2023-04-21 ENCOUNTER — Encounter: Payer: Self-pay | Admitting: Family Medicine

## 2023-04-22 MED ORDER — TRULICITY 4.5 MG/0.5ML ~~LOC~~ SOAJ: 4.5000 mg | SUBCUTANEOUS | 2 refills | Status: DC

## 2023-04-22 NOTE — Telephone Encounter (Signed)
Nurses May go ahead and increase Trulicity 4.5 mg once per week 1 month supply with 2 additional refills Patient to do his lab work before his follow-up office visit in early May thank you

## 2023-04-29 LAB — CMP14+EGFR
ALT: 38 IU/L (ref 0–44)
AST: 22 IU/L (ref 0–40)
Albumin/Globulin Ratio: 2 (ref 1.2–2.2)
Albumin: 4.6 g/dL (ref 4.3–5.2)
Alkaline Phosphatase: 97 IU/L (ref 44–121)
BUN/Creatinine Ratio: 14 (ref 9–20)
BUN: 14 mg/dL (ref 6–20)
Bilirubin Total: 0.3 mg/dL (ref 0.0–1.2)
CO2: 23 mmol/L (ref 20–29)
Calcium: 9.7 mg/dL (ref 8.7–10.2)
Chloride: 105 mmol/L (ref 96–106)
Creatinine, Ser: 0.98 mg/dL (ref 0.76–1.27)
Globulin, Total: 2.3 g/dL (ref 1.5–4.5)
Glucose: 107 mg/dL — ABNORMAL HIGH (ref 70–99)
Potassium: 4.9 mmol/L (ref 3.5–5.2)
Sodium: 144 mmol/L (ref 134–144)
Total Protein: 6.9 g/dL (ref 6.0–8.5)
eGFR: 107 mL/min/{1.73_m2} (ref >59)

## 2023-04-29 LAB — MICROALBUMIN / CREATININE URINE RATIO
Creatinine, Urine: 163.9 mg/dL
Microalb/Creat Ratio: 18 mg/g creat (ref 0–29)
Microalbumin, Urine: 29.7 ug/mL

## 2023-04-29 LAB — LIPID PANEL
Chol/HDL Ratio: 5.2 ratio — ABNORMAL HIGH (ref 0.0–5.0)
Cholesterol, Total: 135 mg/dL (ref 100–199)
HDL: 26 mg/dL — ABNORMAL LOW (ref >39)
LDL Chol Calc (NIH): 58 mg/dL (ref 0–99)
Triglycerides: 327 mg/dL — ABNORMAL HIGH (ref 0–149)
VLDL Cholesterol Cal: 51 mg/dL — ABNORMAL HIGH (ref 5–40)

## 2023-04-29 LAB — HEMOGLOBIN A1C
Est. average glucose Bld gHb Est-mCnc: 120 mg/dL
Hgb A1c MFr Bld: 5.8 % — ABNORMAL HIGH (ref 4.8–5.6)

## 2023-04-29 LAB — FERRITIN: Ferritin: 257 ng/mL (ref 30–400)

## 2023-05-04 ENCOUNTER — Ambulatory Visit: Payer: No Typology Code available for payment source | Admitting: Family Medicine

## 2023-05-04 VITALS — BP 135/87 | HR 92 | Ht 71.0 in | Wt 257.6 lb

## 2023-05-04 DIAGNOSIS — E119 Type 2 diabetes mellitus without complications: Secondary | ICD-10-CM

## 2023-05-04 DIAGNOSIS — Z7984 Long term (current) use of oral hypoglycemic drugs: Secondary | ICD-10-CM

## 2023-05-04 DIAGNOSIS — E785 Hyperlipidemia, unspecified: Secondary | ICD-10-CM

## 2023-05-04 DIAGNOSIS — F17200 Nicotine dependence, unspecified, uncomplicated: Secondary | ICD-10-CM

## 2023-05-04 DIAGNOSIS — R0683 Snoring: Secondary | ICD-10-CM

## 2023-05-04 DIAGNOSIS — E1169 Type 2 diabetes mellitus with other specified complication: Secondary | ICD-10-CM

## 2023-05-04 MED ORDER — TRULICITY 4.5 MG/0.5ML ~~LOC~~ SOAJ: 4.5000 mg | SUBCUTANEOUS | 5 refills | Status: DC

## 2023-05-04 NOTE — Progress Notes (Signed)
Subjective:    Patient ID: Garrett Lee, male    DOB: May 27, 1996, 27 y.o.   MRN: [id number]  HPI Patient arrives today for 3 month follow for diabetes. Patient states no concerns or issues today.  Type 2 diabetes mellitus without complication, without long-term current use of insulin (HCC)  Hyperlipidemia associated with type 2 diabetes mellitus (HCC)  Snores  Morbid obesity (HCC)  Outpatient Encounter Medications as of 05/04/2023  Medication Sig   escitalopram (LEXAPRO) 10 MG tablet Take 1 tablet (10 mg total) by mouth daily.   metFORMIN (GLUCOPHAGE-XR) 500 MG 24 hr tablet One daily   rosuvastatin (CRESTOR) 10 MG tablet Take 1 tablet (10 mg total) by mouth daily.   valsartan (DIOVAN) 160 MG tablet Take 1 tablet (160 mg total) by mouth daily.   [DISCONTINUED] Dulaglutide (TRULICITY) 4.5 MG/0.5ML SOPN Inject 4.5 mg as directed once a week.   Dulaglutide (TRULICITY) 4.5 MG/0.5ML SOPN Inject 4.5 mg as directed once a week.   No facility-administered encounter medications on file as of 05/04/2023.    Results for orders placed or performed in visit on 03/16/23  Microalbumin/Creatinine Ratio, Urine  Result Value Ref Range   Creatinine, Urine 163.9 Not Estab. mg/dL   Microalbumin, Urine 40.9 Not Estab. ug/mL   Microalb/Creat Ratio 18 0 - 29 mg/g creat  Hemoglobin A1c  Result Value Ref Range   Hgb A1c MFr Bld 5.8 (H) 4.8 - 5.6 %   Est. average glucose Bld gHb Est-mCnc 120 mg/dL  Lipid panel  Result Value Ref Range   Cholesterol, Total 135 100 - 199 mg/dL   Triglycerides 811 (H) 0 - 149 mg/dL   HDL 26 (L) >91 mg/dL   VLDL Cholesterol Cal 51 (H) 5 - 40 mg/dL   LDL Chol Calc (NIH) 58 0 - 99 mg/dL   Chol/HDL Ratio 5.2 (H) 0.0 - 5.0 ratio  CMP14+EGFR  Result Value Ref Range   Glucose 107 (H) 70 - 99 mg/dL   BUN 14 6 - 20 mg/dL   Creatinine, Ser 4.78 0.76 - 1.27 mg/dL   eGFR 295 >62 ZH/YQM/5.78   BUN/Creatinine Ratio 14 9 - 20   Sodium 144 134 - 144 mmol/L   Potassium 4.9 3.5 -  5.2 mmol/L   Chloride 105 96 - 106 mmol/L   CO2 23 20 - 29 mmol/L   Calcium 9.7 8.7 - 10.2 mg/dL   Total Protein 6.9 6.0 - 8.5 g/dL   Albumin 4.6 4.3 - 5.2 g/dL   Globulin, Total 2.3 1.5 - 4.5 g/dL   Albumin/Globulin Ratio 2.0 1.2 - 2.2   Bilirubin Total 0.3 0.0 - 1.2 mg/dL   Alkaline Phosphatase 97 44 - 121 IU/L   AST 22 0 - 40 IU/L   ALT 38 0 - 44 IU/L  Ferritin  Result Value Ref Range   Ferritin 257 30 - 400 ng/mL   Patient with a history of elevated ferritin and trying to eat healthy History of fatty liver losing weight with medication and healthy diet and activity The patient was seen today as part of a comprehensive diabetic check up. Patient has diabetes Patient relates good compliance with taking the medication. We discussed their diet and exercise activities  We also discussed the importance of notifying us if any excessively high glucoses or low sugars.  Patient for blood pressure check up.  The patient does have hypertension.   Patient relates dietary measures try to minimize salt The importance of healthy diet and activity were  discussed Patient relates compliance  Patient here for follow-up regarding cholesterol.    Patient relates taking medication on a regular basis Denies problems with medication Importance of dietary measures discussed Regular lab work regarding lipid and liver was checked and if needing additional labs was appropriately ordered     Review of Systems     Objective:   Physical Exam  General-in no acute distress Eyes-no discharge Lungs-respiratory rate normal, CTA CV-no murmurs,RRR Extremities skin warm dry no edema Neuro grossly normal Behavior normal, alert       Assessment & Plan:  1. Type 2 diabetes mellitus without complication, without long-term current use of insulin (HCC) The patient was seen today as part of a comprehensive visit for diabetes. The importance of keeping her A1c at or below 7 range was discussed.   Discussed diet, activity, and medication compliance Emphasized healthy eating primarily with vegetables fruits and if utilizing meats lean meats such as chicken or fish grilled baked broiled Avoid sugary drinks Minimize and avoid processed foods Fit in regular physical activity preferably 25 to 30 minutes 4 times per week Standard follow-up visit recommended.  Patient aware lack of control and follow-up increases risk of diabetic complications. Regular follow-up visits Yearly ophthalmology Yearly foot exam    2. Hyperlipidemia associated with type 2 diabetes mellitus (HCC) Hyperlipidemia-importance of diet, weight control, activity, compliance with medications discussed.   Recent labs reviewed.   Any additional labs or refills ordered.   Importance of keeping under good control discussed. Regular follow-up visits discussed   3. Snores Patient does snore but we did discuss setting him up for sleep study patient also with fatigue tiredness during the day and states he has frequent awakenings at night  4. Morbid obesity (HCC) Significant obesity.  Very important to work hard on healthy diet regular physical activity.  Portion control and minimizing carbohydrates and excessive sweets.  Fitting and regular physical activity would be wise.  HTN- patient seen for follow-up regarding HTN.   Diet, medication compliance, appropriate labs and refills were completed.   Importance of keeping blood pressure under good control to lessen the risk of complications discussed Regular follow-up visits discussed   Recommend follow-up visit 6 months continue current meds check labs before next visit  Chantix prescribed patient is a smoker he has been encouraged to quit smoking.  If Chantix not covered he should talk to [place] about Wellbutrin he will send [place] a message

## 2023-11-03 ENCOUNTER — Ambulatory Visit: Payer: No Typology Code available for payment source | Admitting: Family Medicine

## 2023-11-08 ENCOUNTER — Ambulatory Visit: Payer: No Typology Code available for payment source | Admitting: Family Medicine

## 2023-11-08 VITALS — BP 132/98 | HR 101 | Ht 71.0 in | Wt 269.2 lb

## 2023-11-08 DIAGNOSIS — R748 Abnormal levels of other serum enzymes: Secondary | ICD-10-CM | POA: Diagnosis not present

## 2023-11-08 DIAGNOSIS — E119 Type 2 diabetes mellitus without complications: Secondary | ICD-10-CM

## 2023-11-08 DIAGNOSIS — F439 Reaction to severe stress, unspecified: Secondary | ICD-10-CM

## 2023-11-08 DIAGNOSIS — Z79899 Other long term (current) drug therapy: Secondary | ICD-10-CM

## 2023-11-08 DIAGNOSIS — E1169 Type 2 diabetes mellitus with other specified complication: Secondary | ICD-10-CM | POA: Diagnosis not present

## 2023-11-08 DIAGNOSIS — G4730 Sleep apnea, unspecified: Secondary | ICD-10-CM

## 2023-11-08 DIAGNOSIS — M2550 Pain in unspecified joint: Secondary | ICD-10-CM

## 2023-11-08 DIAGNOSIS — I10 Essential (primary) hypertension: Secondary | ICD-10-CM | POA: Diagnosis not present

## 2023-11-08 DIAGNOSIS — E785 Hyperlipidemia, unspecified: Secondary | ICD-10-CM

## 2023-11-08 MED ORDER — ROSUVASTATIN CALCIUM 10 MG PO TABS: 10.0000 mg | ORAL_TABLET | Freq: Every day | ORAL | 3 refills | Status: DC

## 2023-11-08 MED ORDER — METFORMIN HCL ER 500 MG PO TB24: ORAL_TABLET | ORAL | 1 refills | Status: DC

## 2023-11-08 MED ORDER — TRULICITY 4.5 MG/0.5ML ~~LOC~~ SOAJ: 4.5000 mg | SUBCUTANEOUS | 5 refills | Status: DC

## 2023-11-08 MED ORDER — VALSARTAN-HYDROCHLOROTHIAZIDE 160-25 MG PO TABS: 1.0000 | ORAL_TABLET | Freq: Every day | ORAL | 1 refills | Status: DC

## 2023-11-08 MED ORDER — ESCITALOPRAM OXALATE 20 MG PO TABS: 20.0000 mg | ORAL_TABLET | Freq: Every day | ORAL | 1 refills | Status: DC

## 2023-11-08 NOTE — Progress Notes (Signed)
   Subjective:    Patient ID: Garrett Lee, male    DOB: May 04, 1996, 27 y.o.   MRN: [id number]  HPI Patient overall is trying to do the best he can Handles the stress of raising a family but is handling well Also works hard Has had very active family Trying to do the best he can Denies being depressed Denies being anxious Patient denies any chest tightness pressure pain Does have underlying diabetes hyperlipidemia hypertension morbid obesity Has nighttime snoring Has some level of fatigue tiredness during the day but does not get sleepy when he drives Energy level not as good as he thinks that should be    Review of Systems     Objective:   Physical Exam  General-in no acute distress Eyes-no discharge Lungs-respiratory rate normal, CTA CV-no murmurs,RRR Extremities skin warm dry no edema Neuro grossly normal Behavior normal, alert Mild obesity      Assessment & Plan:   1. Type 2 diabetes mellitus without complication, without long-term current use of insulin (HCC) Diabetes previously under good control he had a lapse in his medicine for about a month but is back to taking his medicine they will he will do his labs in approximately 7 to 14 days - Hemoglobin A1c - Basic Metabolic Panel - Microalbumin/Creatinine Ratio, Urine  2. Hyperlipidemia associated with type 2 diabetes mellitus (HCC) Lab work continue medication - Lipid panel  3. Elevated liver enzymes Healthy diet portion control GLP-1 will help as well  4. Hypertension, unspecified type Blood pressure moderately elevated add HCTZ to medication patient will be doing a work physical in December he will send [place] the results of his blood pressure readings  5. Sleep apnea, unspecified type Patient has symptoms of sleep apnea would benefit from sleep apnea evaluation and studies - Ambulatory referral to Sleep Studies  6. Morbid obesity (HCC) Portion control regular physical activity Trulicity will help  7.  Stress Bump up dose of medication patient to give [place] feedback in the course the next 8 to 10 weeks how that is doing - Uric Acid  8. High risk medication use Lab work ordered - Hepatic Function Panel  9. Arthralgia, unspecified joint Describes pain in the MTP consistent with the possibility of gout check uric acid level does not have gout currently - Uric Acid

## 2023-12-14 ENCOUNTER — Encounter: Payer: Self-pay | Admitting: Neurology

## 2023-12-14 ENCOUNTER — Ambulatory Visit (INDEPENDENT_AMBULATORY_CARE_PROVIDER_SITE_OTHER): Payer: No Typology Code available for payment source | Admitting: Neurology

## 2023-12-14 VITALS — BP 148/90 | HR 75 | Ht 71.0 in | Wt 276.0 lb

## 2023-12-14 DIAGNOSIS — G4719 Other hypersomnia: Secondary | ICD-10-CM

## 2023-12-14 DIAGNOSIS — R030 Elevated blood-pressure reading, without diagnosis of hypertension: Secondary | ICD-10-CM

## 2023-12-14 DIAGNOSIS — E669 Obesity, unspecified: Secondary | ICD-10-CM

## 2023-12-14 DIAGNOSIS — R0683 Snoring: Secondary | ICD-10-CM

## 2023-12-14 DIAGNOSIS — Z9189 Other specified personal risk factors, not elsewhere classified: Secondary | ICD-10-CM | POA: Diagnosis not present

## 2023-12-14 DIAGNOSIS — R0681 Apnea, not elsewhere classified: Secondary | ICD-10-CM

## 2023-12-14 DIAGNOSIS — R519 Headache, unspecified: Secondary | ICD-10-CM

## 2023-12-14 NOTE — Progress Notes (Signed)
Subjective:    Patient ID: Garrett Lee is a 27 y.o. male.  HPI    Huston Foley, MD, PhD [facility] [address] [address] [place], [place] [address]  Dear Dr. Gerda Diss,  I saw your patient, Garrett Lee, upon your kind request in my sleep clinic today for initial consultation of his sleep disorder, in particular, concern for underlying obstructive sleep apnea.  The patient is unaccompanied today.  As you know, [name] is a 27 year old male with an underlying medical history of diabetes, smoking, hyperlipidemia, elevated liver enzymes, hypertension, and obesity, who reports snoring and excessive daytime somnolence.  He has had witnessed apneas in the past per wife's observation.  He lives with his wife and 6 children, ages 40 through 48 months.  They have 2 large dogs in the family, the dogs do not sleep in the bedroom with him.  He has a TV in the bedroom and his wife likes to watch it at night but puts it on a sleep timer.  Sleep at times.  He does not take up rested typically.  His Epworth sleepiness score is 14 out of 24, fatigue severity score is 44 out of 63.  I reviewed your office note from 11/08/2023.  He is not aware of any family history of sleep apnea.  His weight has been fluctuating, he is currently working on weight loss.  He had a tonsillectomy as a child.  Bedtime is around 9 and rise time around 5:45 AM.  He works for the city of [employer].  He smokes about a half a pack per day, he drinks alcohol less than once a week, he drinks caffeine in the form of tea or soda, 2-3 servings altogether per day on average.  He denies nightly nocturia but has had rare morning aches.  He also has a history of infrequent migraines.  He had an elevated blood pressure value earlier during triage but retesting showed improvement.  He denies any symptoms from blood pressure elevation and reports that he took his blood pressure medication about an hour before  the appointment.  His Past Medical History Is Significant For: Past Medical History:  Diagnosis Date   Hypertension    PONV (postoperative nausea and vomiting)     His Past Surgical History Is Significant For: Past Surgical History:  Procedure Laterality Date   FIBULA FRACTURE SURGERY     SHOULDER SURGERY     TONSILLECTOMY     tubes in ears     VASECTOMY Bilateral 09/02/2022   Procedure: VASECTOMY;  Surgeon: Malen Gauze, MD;  Location: [facility];  Service: Urology;  Laterality: Bilateral;    His Family History Is Significant For: Family History  Problem Relation Age of Onset   Diabetes Mother    Stroke Maternal Grandmother    Diabetes type I Maternal Grandmother    Heart disease Maternal Grandmother    Heart disease Maternal Grandfather    Colon cancer Paternal Grandfather 64 - 11   Colon polyps Neg Hx    Sleep apnea Neg Hx     His Social History Is Significant For: Social History   Socioeconomic History   Marital status: Married    Spouse name: Not on file   Number of children: Not on file   Years of education: Not on file   Highest education level: Not on file  Occupational History   Occupation: IT sales professional  Tobacco Use   Smoking status: Every Day  Current packs/day: 0.50    Average packs/day: 0.5 packs/day for 15.0 years (7.5 ttl pk-yrs)    Types: Cigarettes    Start date: 2010   Smokeless tobacco: Current  Vaping Use   Vaping status: Never Used  Substance and Sexual Activity   Alcohol use: Yes    Alcohol/week: 2.0 standard drinks of alcohol    Types: 1 Cans of beer, 1 Shots of liquor per week    Comment: once per month.   Drug use: No   Sexual activity: Not on file  Other Topics Concern   Not on file  Social History Narrative   Not on file   Social Drivers of Health   Financial Resource Strain: Not on file  Food Insecurity: Not on file  Transportation Needs: Not on file  Physical Activity: Not on file  Stress: Not on file  Social  Connections: Not on file    His Allergies Are:  No Known Allergies:   His Current Medications Are:  Outpatient Encounter Medications as of 12/14/2023  Medication Sig   Dulaglutide (TRULICITY) 4.5 MG/0.5ML SOAJ Inject 4.5 mg as directed once a week.   escitalopram (LEXAPRO) 20 MG tablet Take 1 tablet (20 mg total) by mouth daily.   metFORMIN (GLUCOPHAGE-XR) 500 MG 24 hr tablet One daily   rosuvastatin (CRESTOR) 10 MG tablet Take 1 tablet (10 mg total) by mouth daily.   valsartan-hydrochlorothiazide (DIOVAN-HCT) 160-25 MG tablet Take 1 tablet by mouth daily.   No facility-administered encounter medications on file as of 12/14/2023.  :   Review of Systems:  Out of a complete 14 point review of systems, all are reviewed and negative with the exception of these symptoms as listed below:   Review of Systems  Neurological:        Pt is here for sleep consult. No previous SS. Pt reports he snores, has HTN and feels fatigued. Pt denies headaches. ESS 14, FSS 44.     Objective:  Neurological Exam  Physical Exam Physical Examination:   Vitals:   12/14/23 1104 12/14/23 1108  BP: (!) 153/91 (!) 148/90  Pulse: 75     General Examination: The patient is a very pleasant 27 y.o. male in no acute distress. He appears well-developed and well-nourished and well groomed.   HEENT: Normocephalic, atraumatic, pupils are equal, round and reactive to light, extraocular tracking is good without limitation to gaze excursion or nystagmus noted. Hearing is grossly intact. Face is symmetric with normal facial animation. Speech is clear with no dysarthria noted. There is no hypophonia. There is no lip, neck/head, jaw or voice tremor. Neck is supple with full range of passive and active motion. There are no carotid bruits on auscultation. Oropharynx exam reveals: mild mouth dryness, good dental hygiene and mild airway crowding, due to small airway entry, Mallampati class III, tonsils absent, slightly wider  uvula.  Neck circumference 19 inches, no significant overbite noted, may be minimal, evidence of tooth grinding noted.  Tongue protrudes centrally and palate elevates symmetrically.  Chest: Clear to auscultation without wheezing, rhonchi or crackles noted.  Heart: S1+S2+0, regular and normal without murmurs, rubs or gallops noted.   Abdomen: Soft, non-tender and non-distended.  Extremities: There is no pitting edema in the distal lower extremities bilaterally.   Skin: Warm and dry without trophic changes noted.   Musculoskeletal: exam reveals no obvious joint deformities.   Neurologically:  Mental status: The patient is awake, alert and oriented in all 4 spheres. His immediate and remote  memory, attention, language skills and fund of knowledge are appropriate. There is no evidence of aphasia, agnosia, apraxia or anomia. Speech is clear with normal prosody and enunciation. Thought process is linear. Mood is normal and affect is normal.  Cranial nerves II - XII are as described above under HEENT exam.  Motor exam: Normal bulk, strength and tone is noted. There is no obvious action or resting tremor.  Fine motor skills and coordination: grossly intact.  Cerebellar testing: No dysmetria or intention tremor. There is no truncal or gait ataxia.  Sensory exam: intact to light touch in the upper and lower extremities.  Gait, station and balance: He stands easily. No veering to one side is noted. No leaning to one side is noted. Posture is age-appropriate and stance is narrow based. Gait shows normal stride length and normal pace. No problems turning are noted.   Assessment and Plan:   In summary, ETHIAN JOVANOVIC is a very pleasant 28 y.o.-year old male with an underlying medical history of diabetes, smoking, hyperlipidemia, elevated liver enzymes, hypertension, and obesity, whose history and physical exam are concerning for sleep disordered breathing, particularly obstructive sleep apnea (OSA).  While  a laboratory attended sleep study is typically considered "gold standard" for evaluation of sleep disordered breathing, we mutually agreed to proceed with a home sleep test at this time.   I had a long chat with the patient about my findings and the diagnosis of sleep apnea, particularly OSA, its prognosis and treatment options. We talked about medical/conservative treatments, surgical interventions and non-pharmacological approaches for symptom control. I explained, in particular, the risks and ramifications of untreated moderate to severe OSA, especially with respect to developing cardiovascular disease down the road, including congestive heart failure (CHF), difficult to treat hypertension, cardiac arrhythmias (particularly A-fib), neurovascular complications including TIA, stroke and dementia. Even type 2 diabetes has, in part, been linked to untreated OSA. Symptoms of untreated OSA may include (but may not be limited to) daytime sleepiness, nocturia (i.e. frequent nighttime urination), memory problems, mood irritability and suboptimally controlled or worsening mood disorder such as depression and/or anxiety, lack of energy, lack of motivation, physical discomfort, as well as recurrent headaches, especially morning or nocturnal headaches. We talked about the importance of maintaining a healthy lifestyle and striving for healthy weight.  The importance of complete smoking cessation was also addressed.  In addition, we talked about the importance of striving for and maintaining good sleep hygiene. I recommended a sleep study at this time. I outlined the differences between a laboratory attended sleep study which is considered more comprehensive and accurate over the option of a home sleep test (HST); the latter may lead to underestimation of sleep disordered breathing in some instances and does not help with diagnosing upper airway resistance syndrome and is not accurate enough to diagnose primary central sleep  apnea typically. I outlined possible surgical and non-surgical treatment options of OSA, including the use of a positive airway pressure (PAP) device (i.e. CPAP, AutoPAP/APAP or BiPAP in certain circumstances), a custom-made dental device (aka oral appliance, which would require a referral to a specialist dentist or orthodontist typically, and is generally speaking not considered for patients with full dentures or edentulous state), upper airway surgical options, such as traditional UPPP (which is not considered a first-line treatment) or the Inspire device (hypoglossal nerve stimulator, which would involve a referral for consultation with an ENT surgeon, after careful selection, following inclusion criteria - also not first-line treatment). I explained the PAP  treatment option to the patient in detail, as this is generally considered first-line treatment.  The patient indicated that he would be willing to try PAP therapy, if the need arises. I explained the importance of being compliant with PAP treatment, not only for insurance purposes but primarily to improve patient's symptoms symptoms, and for the patient's long term health benefit, including to reduce His cardiovascular risks longer-term.    We will pick up our discussion about the next steps and treatment options after testing.  We will keep him posted as to the test results by phone call and/or MyChart messaging where possible.  We will plan to follow-up in sleep clinic accordingly as well.  I answered all his questions today and the patient was in agreement.   I encouraged him to call with any interim questions, concerns, problems or updates or email [place] through MyChart.  Generally speaking, sleep test authorizations may take up to 2 weeks, sometimes less, sometimes longer, the patient is encouraged to get in touch with [place] if they do not hear back from the sleep lab staff directly within the next 2 weeks.  Thank you very much for allowing me to  participate in the care of this nice patient. If I can be of any further assistance to you please do not hesitate to call me at [phone].  Sincerely,   Huston Foley, MD, PhD

## 2023-12-14 NOTE — Patient Instructions (Signed)

## 2024-01-06 ENCOUNTER — Ambulatory Visit: Payer: No Typology Code available for payment source | Admitting: Neurology

## 2024-01-06 DIAGNOSIS — G4733 Obstructive sleep apnea (adult) (pediatric): Secondary | ICD-10-CM | POA: Diagnosis not present

## 2024-01-06 DIAGNOSIS — Z9189 Other specified personal risk factors, not elsewhere classified: Secondary | ICD-10-CM

## 2024-01-06 DIAGNOSIS — R519 Headache, unspecified: Secondary | ICD-10-CM

## 2024-01-06 DIAGNOSIS — G4719 Other hypersomnia: Secondary | ICD-10-CM

## 2024-01-06 DIAGNOSIS — E669 Obesity, unspecified: Secondary | ICD-10-CM

## 2024-01-06 DIAGNOSIS — R0683 Snoring: Secondary | ICD-10-CM

## 2024-01-06 DIAGNOSIS — R030 Elevated blood-pressure reading, without diagnosis of hypertension: Secondary | ICD-10-CM

## 2024-01-06 DIAGNOSIS — R0681 Apnea, not elsewhere classified: Secondary | ICD-10-CM

## 2024-01-24 NOTE — Progress Notes (Signed)
 See procedure note.

## 2024-01-26 NOTE — Procedures (Signed)
[facility]  HOME SLEEP TEST (SANSA) REPORT (Mail-Out Device):   STUDY DATE: 01/11/24  DOB: 07/04/1996  MRN: [id number]  ORDERING CLINICIAN: Huston Foley, MD, PhD   REFERRING CLINICIAN: Babs Sciara, MD   CLINICAL INFORMATION/HISTORY: 28 year old male with an underlying medical history of diabetes, smoking, hyperlipidemia, elevated liver enzymes, hypertension, and obesity, who reports snoring and excessive daytime somnolence.  He has had witnessed apneas.   PATIENT'S LAST REPORTED EPWORTH SLEEPINESS SCORE (ESS): 14/24.  BMI (at the time of sleep clinic visit and/or test date): 38.5 kg/m  FINDINGS:   Study Protocol:    The SANSA single-point-of-skin-contact chest-worn sensor - an FDA and DOT approved type 4 home sleep test device - measures eight physiological channels,  including blood oxygen saturation (measured via PPG [photoplethysmography]), EKG-derived heart rate, respiratory effort, chest movement (measured via accelerometer), snoring, body position, and actigraphy. The device is designed to be worn for up to 10 hours per study.   Sleep Summary:   Total Recording Time (hours, min): 9 hours, 7 min  Total Effective Sleep Time (hours, min):  7 hours, 20 min  Sleep Efficiency (%):    81%   Respiratory Indices:   Calculated sAHI (per hour):  10.8/hour         Oxygen Saturation Statistics:    Oxygen Saturation (%) Mean: 91.8%   Minimum oxygen saturation (%):                 70.5%   O2 Saturation Range (%): 70.5 - 100%    Pulse Rate Statistics:   Pulse Mean (bpm):    75/min    Pulse Range (48 - 111/min)   Snoring: mild to moderate   IMPRESSION/DIAGNOSES:   OSA (obstructive sleep apnea),    RECOMMENDATIONS:   This home sleep test demonstrates overall mild obstructive sleep apnea with a total AHI of 10.8/hour and O2 nadir of 70.5%. Snoring was detected, in the mild to moderate range. Given the patient's medical history and sleep  related complaints, therapy with a  positive airway pressure device is recommended. Treatment can be achieved in the form of autoPAP trial/titration at home for now. A full night, in-lab PAP titration study may aid in improving proper treatment settings and with mask fit, if needed, down the road. Alternative treatments may include weight loss (where appropriate) along with avoidance of the supine sleep position (if possible), or an oral appliance in appropriate candidates.   Please note that untreated obstructive sleep apnea may carry additional perioperative morbidity. Patients with significant obstructive sleep apnea should receive perioperative PAP therapy and the surgeons and particularly the anesthesiologist should be informed of the diagnosis and the severity of the sleep disordered breathing. The patient should be cautioned not to drive, work at heights, or operate dangerous or heavy equipment when tired or sleepy. Review and reiteration of good sleep hygiene measures should be pursued with any patient. Other causes of the patient's symptoms, including circadian rhythm disturbances, an underlying mood disorder, medication effect and/or an underlying medical problem cannot be ruled out based on this test. Clinical correlation is recommended.  The patient and his referring provider will be notified of the test results. The patient will be seen in follow up in sleep clinic at [facility], as necessary.  I certify that I have reviewed the raw data recording prior to the issuance of this report in accordance with the standards of the American Academy of Sleep Medicine (AASM).    INTERPRETING PHYSICIAN:  Huston Foley, MD, PhD Medical Director, [facility]) Diplomat, ABPN (Neurology and Sleep)   [facility] [address] [place], [place] [address] [phone]

## 2024-01-26 NOTE — Addendum Note (Signed)
Addended by: Huston Foley on: 01/26/2024 12:02 PM   Modules accepted: Orders

## 2024-02-15 NOTE — Telephone Encounter (Signed)
Attempted to contact pt regarding SSR, no answer.

## 2024-02-23 NOTE — Telephone Encounter (Addendum)
 Contacted pt due to not reading [facility] msg. Pt agreed to plan, order sent.    Garrett Lee, CMA  [facility] Tomie China; Overton Mam, Lazarus Gowda, [place] New orders have been placed for the above pt, DOB: 06/25/1996 Thanks New, Maryruth Bun, Abbe Amsterdam, CMA; Alain Honey; Overton Mam, Dolanda; 1 other Received, thank you!

## 2024-03-05 ENCOUNTER — Encounter: Payer: Self-pay | Admitting: Gastroenterology

## 2024-04-18 ENCOUNTER — Other Ambulatory Visit (HOSPITAL_COMMUNITY): Payer: Self-pay

## 2024-05-07 ENCOUNTER — Ambulatory Visit: Payer: No Typology Code available for payment source | Admitting: Family Medicine

## 2024-05-10 LAB — BASIC METABOLIC PANEL WITH GFR
BUN/Creatinine Ratio: 18 (ref 9–20)
BUN: 18 mg/dL (ref 6–20)
CO2: 24 mmol/L (ref 20–29)
Calcium: 9.7 mg/dL (ref 8.7–10.2)
Chloride: 102 mmol/L (ref 96–106)
Creatinine, Ser: 0.98 mg/dL (ref 0.76–1.27)
Glucose: 151 mg/dL — ABNORMAL HIGH (ref 70–99)
Potassium: 3.8 mmol/L (ref 3.5–5.2)
Sodium: 141 mmol/L (ref 134–144)
eGFR: 106 mL/min/{1.73_m2} (ref >59)

## 2024-05-10 LAB — HEPATIC FUNCTION PANEL
ALT: 83 IU/L — ABNORMAL HIGH (ref 0–44)
AST: 40 IU/L (ref 0–40)
Albumin: 4.7 g/dL (ref 4.3–5.2)
Alkaline Phosphatase: 122 IU/L — ABNORMAL HIGH (ref 44–121)
Bilirubin Total: 0.2 mg/dL (ref 0.0–1.2)
Bilirubin, Direct: 0.09 mg/dL (ref 0.00–0.40)
Total Protein: 6.8 g/dL (ref 6.0–8.5)

## 2024-05-10 LAB — URIC ACID: Uric Acid: 7.3 mg/dL (ref 3.8–8.4)

## 2024-05-10 LAB — LIPID PANEL
Chol/HDL Ratio: 5.3 ratio — ABNORMAL HIGH (ref 0.0–5.0)
Cholesterol, Total: 143 mg/dL (ref 100–199)
HDL: 27 mg/dL — ABNORMAL LOW (ref >39)
LDL Chol Calc (NIH): 79 mg/dL (ref 0–99)
Triglycerides: 221 mg/dL — ABNORMAL HIGH (ref 0–149)
VLDL Cholesterol Cal: 37 mg/dL (ref 5–40)

## 2024-05-10 LAB — MICROALBUMIN / CREATININE URINE RATIO
Creatinine, Urine: 248.6 mg/dL
Microalb/Creat Ratio: 18 mg/g{creat} (ref 0–29)
Microalbumin, Urine: 44.8 ug/mL

## 2024-05-10 LAB — HEMOGLOBIN A1C
Est. average glucose Bld gHb Est-mCnc: 171 mg/dL
Hgb A1c MFr Bld: 7.6 % — ABNORMAL HIGH (ref 4.8–5.6)

## 2024-05-11 ENCOUNTER — Ambulatory Visit: Admitting: Family Medicine

## 2024-05-11 ENCOUNTER — Encounter: Payer: Self-pay | Admitting: Family Medicine

## 2024-05-11 VITALS — BP 144/80 | HR 68 | Temp 98.2°F | Ht 71.0 in | Wt 267.0 lb

## 2024-05-11 DIAGNOSIS — E1159 Type 2 diabetes mellitus with other circulatory complications: Secondary | ICD-10-CM

## 2024-05-11 DIAGNOSIS — I10 Essential (primary) hypertension: Secondary | ICD-10-CM

## 2024-05-11 DIAGNOSIS — E119 Type 2 diabetes mellitus without complications: Secondary | ICD-10-CM

## 2024-05-11 DIAGNOSIS — F17200 Nicotine dependence, unspecified, uncomplicated: Secondary | ICD-10-CM | POA: Diagnosis not present

## 2024-05-11 MED ORDER — VALSARTAN-HYDROCHLOROTHIAZIDE 160-25 MG PO TABS: 1.0000 | ORAL_TABLET | Freq: Every day | ORAL | 1 refills | Status: DC

## 2024-05-11 MED ORDER — VARENICLINE TARTRATE (STARTER) 0.5 MG X 11 & 1 MG X 42 PO TBPK
ORAL_TABLET | ORAL | 0 refills | Status: DC
Start: 2024-05-11 — End: 2024-08-17

## 2024-05-11 MED ORDER — VARENICLINE TARTRATE 1 MG PO TABS: 1.0000 mg | ORAL_TABLET | Freq: Two times a day (BID) | ORAL | 2 refills | Status: DC

## 2024-05-11 NOTE — Patient Instructions (Signed)
 Healthy diet and regular exercise.  Continue your current medications.  Follow up in 3 months.

## 2024-05-13 DIAGNOSIS — I10 Essential (primary) hypertension: Secondary | ICD-10-CM | POA: Insufficient documentation

## 2024-05-13 NOTE — Assessment & Plan Note (Signed)
 Not at goal. Healthy diet and regular exercise. Continue Valsartan /hydrochlorothiazide .

## 2024-05-13 NOTE — Assessment & Plan Note (Addendum)
 Not at goal. Discussed increasing Metformin  and also discussed healthy diet and regular exercise. Patient wants to proceed with lifestyle changes. Follow up in 3 months.

## 2024-05-13 NOTE — Assessment & Plan Note (Signed)
Restarting Chantix 

## 2024-05-13 NOTE — Progress Notes (Signed)
 Subjective:  Patient ID: Garrett Lee, male    DOB: 01/29/1996  Age: 28 y.o. MRN: [id number]  CC:   Chief Complaint  Patient presents with   Follow-up    6 month f/u     HPI:  28 year old male with the below mentioned medical problems presents for follow up.  BP mildly elevated here today. Compliant with Valsartan /hydrochlorothiazide . Needs refill.  Previously quit smoking on Chantix . He is currently smoking and would like to restart Chantix  to quit again.  A1c 7.6. This is not at goal. Patient reports that he has not been eating healthy over the past few months. Will discuss today. Compliant with Metformin  but is on a low dose (just 500 mg/day).  Patient Active Problem List   Diagnosis Date Noted   Essential hypertension 05/13/2024   Type 2 diabetes mellitus without complication, without long-term current use of insulin (HCC) 10/14/2022   Fatty liver 07/05/2022   Hyperlipidemia associated with type 2 diabetes mellitus (HCC) 07/05/2022   Elevated ferritin 07/05/2022   Elevated transaminase level 06/06/2015   Smoker 06/06/2015   Morbid obesity (HCC) 06/06/2015    Social Hx   Social History   Socioeconomic History   Marital status: Married    Spouse name: Not on file   Number of children: Not on file   Years of education: Not on file   Highest education level: Not on file  Occupational History   Occupation: IT sales professional  Tobacco Use   Smoking status: Every Day    Current packs/day: 0.50    Average packs/day: 0.5 packs/day for 15.4 years (7.7 ttl pk-yrs)    Types: Cigarettes    Start date: 2010   Smokeless tobacco: Current  Vaping Use   Vaping status: Never Used  Substance and Sexual Activity   Alcohol use: Yes    Alcohol/week: 2.0 standard drinks of alcohol    Types: 1 Cans of beer, 1 Shots of liquor per week    Comment: once per month.   Drug use: No   Sexual activity: Not on file  Other Topics Concern   Not on file  Social History Narrative   Not on  file   Social Drivers of Health   Financial Resource Strain: Not on file  Food Insecurity: Not on file  Transportation Needs: Not on file  Physical Activity: Not on file  Stress: Not on file  Social Connections: Not on file    Review of Systems Per HPI  Objective:  BP (!) 144/80   Pulse 68   Temp 98.2 F (36.8 C)   Ht 5\' 11"  (1.803 m)   Wt 267 lb (121.1 kg)   SpO2 99%   BMI 37.24 kg/m      05/11/2024   10:41 AM 05/11/2024   10:09 AM 12/14/2023   11:08 AM  BP/Weight  Systolic BP 144 150 148  Diastolic BP 80 96 90  Wt. (Lbs)  267   BMI  37.24 kg/m2     Physical Exam Vitals and nursing note reviewed.  Constitutional:      General: He is not in acute distress.    Appearance: Normal appearance. He is obese.  HENT:     Head: Normocephalic and atraumatic.  Eyes:     General:        Right eye: No discharge.        Left eye: No discharge.     Conjunctiva/sclera: Conjunctivae normal.  Cardiovascular:     Rate and Rhythm:  Normal rate and regular rhythm.  Pulmonary:     Effort: Pulmonary effort is normal.     Breath sounds: Normal breath sounds. No wheezing, rhonchi or rales.  Neurological:     Mental Status: He is alert.  Psychiatric:        Mood and Affect: Mood normal.        Behavior: Behavior normal.     Lab Results  Component Value Date   WBC 11.7 (H) 12/31/2021   HGB 16.7 12/31/2021   HCT 48.2 12/31/2021   PLT 177 12/31/2021   GLUCOSE 151 (H) 05/09/2024   CHOL 143 05/09/2024   TRIG 221 (H) 05/09/2024   HDL 27 (L) 05/09/2024   LDLCALC 79 05/09/2024   ALT 83 (H) 05/09/2024   AST 40 05/09/2024   NA 141 05/09/2024   K 3.8 05/09/2024   CL 102 05/09/2024   CREATININE 0.98 05/09/2024   BUN 18 05/09/2024   CO2 24 05/09/2024   TSH 1.180 10/14/2022   HGBA1C 7.6 (H) 05/09/2024     Assessment & Plan:  Type 2 diabetes mellitus without complication, without long-term current use of insulin (HCC) Assessment & Plan: Not at goal. Discussed increasing  Metformin  and also discussed healthy diet and regular exercise. Patient wants to proceed with lifestyle changes. Follow up in 3 months.    Smoker Assessment & Plan: Restarting Chantix .  Orders: -     Varenicline  Tartrate (Starter); Days 1 to 3: 0.5 mg once daily. Days 4 to 7: 0.5 mg twice daily. Maintenance (day 8 and later): 1 mg twice daily.  Dispense: 53 each; Refill: 0 -     Varenicline  Tartrate; Take 1 tablet (1 mg total) by mouth 2 (two) times daily.  Dispense: 180 tablet; Refill: 2  Essential hypertension Assessment & Plan: Not at goal. Healthy diet and regular exercise. Continue Valsartan /hydrochlorothiazide .   Orders: -     Valsartan -hydroCHLOROthiazide ; Take 1 tablet by mouth daily.  Dispense: 90 tablet; Refill: 1    Follow-up:  3 months  Clark Clowdus Debrah Fan DO [facility]

## 2024-05-14 ENCOUNTER — Ambulatory Visit: Payer: Self-pay | Admitting: Family Medicine

## 2024-05-15 ENCOUNTER — Other Ambulatory Visit: Payer: Self-pay | Admitting: Family Medicine

## 2024-08-12 ENCOUNTER — Emergency Department (HOSPITAL_COMMUNITY)
Admission: EM | Admit: 2024-08-12 | Discharge: 2024-08-12 | Disposition: A | Discharge status: Home / Self Care | Attending: Emergency Medicine | Admitting: Emergency Medicine

## 2024-08-12 ENCOUNTER — Encounter (HOSPITAL_COMMUNITY): Payer: Self-pay

## 2024-08-12 ENCOUNTER — Other Ambulatory Visit: Payer: Self-pay

## 2024-08-12 ENCOUNTER — Emergency Department (HOSPITAL_COMMUNITY)

## 2024-08-12 DIAGNOSIS — Z79899 Other long term (current) drug therapy: Secondary | ICD-10-CM | POA: Diagnosis not present

## 2024-08-12 DIAGNOSIS — N132 Hydronephrosis with renal and ureteral calculous obstruction: Secondary | ICD-10-CM | POA: Insufficient documentation

## 2024-08-12 DIAGNOSIS — Z7984 Long term (current) use of oral hypoglycemic drugs: Secondary | ICD-10-CM | POA: Insufficient documentation

## 2024-08-12 DIAGNOSIS — E119 Type 2 diabetes mellitus without complications: Secondary | ICD-10-CM | POA: Diagnosis not present

## 2024-08-12 DIAGNOSIS — I10 Essential (primary) hypertension: Secondary | ICD-10-CM | POA: Insufficient documentation

## 2024-08-12 DIAGNOSIS — R109 Unspecified abdominal pain: Secondary | ICD-10-CM | POA: Diagnosis present

## 2024-08-12 DIAGNOSIS — N200 Calculus of kidney: Secondary | ICD-10-CM

## 2024-08-12 HISTORY — DX: Type 2 diabetes mellitus without complications: E11.9

## 2024-08-12 LAB — LIPASE, BLOOD: Lipase: 133 U/L — ABNORMAL HIGH (ref 11–51)

## 2024-08-12 LAB — CBC WITH DIFFERENTIAL/PLATELET
Basophils Absolute: 0 K/uL (ref 0.0–0.1)
Basophils Relative: 0 %
Eosinophils Absolute: 2.3 K/uL — ABNORMAL HIGH (ref 0.0–0.5)
Eosinophils Relative: 15 %
HCT: 45.8 % (ref 39.0–52.0)
Hemoglobin: 15.5 g/dL (ref 13.0–17.0)
Lymphocytes Relative: 23 %
Lymphs Abs: 3.6 K/uL (ref 0.7–4.0)
MCH: 30.6 pg (ref 26.0–34.0)
MCHC: 33.8 g/dL (ref 30.0–36.0)
MCV: 90.3 fL (ref 80.0–100.0)
Monocytes Absolute: 0.2 K/uL (ref 0.1–1.0)
Monocytes Relative: 1 %
Neutro Abs: 9.5 K/uL — ABNORMAL HIGH (ref 1.7–7.7)
Neutrophils Relative %: 61 %
Platelets: 218 K/uL (ref 150–400)
RBC: 5.07 MIL/uL (ref 4.22–5.81)
RDW: 12.2 % (ref 11.5–15.5)
WBC: 15.6 K/uL — ABNORMAL HIGH (ref 4.0–10.5)
nRBC: 0 % (ref 0.0–0.2)

## 2024-08-12 LAB — URINALYSIS, ROUTINE W REFLEX MICROSCOPIC
Bacteria, UA: NONE SEEN
Bilirubin Urine: NEGATIVE
Glucose, UA: NEGATIVE mg/dL
Ketones, ur: NEGATIVE mg/dL
Leukocytes,Ua: NEGATIVE
Nitrite: NEGATIVE
Protein, ur: 30 mg/dL — AB
Specific Gravity, Urine: 1.031 — ABNORMAL HIGH (ref 1.005–1.030)
pH: 5 (ref 5.0–8.0)

## 2024-08-12 LAB — COMPREHENSIVE METABOLIC PANEL WITH GFR
ALT: 38 U/L (ref 0–44)
AST: 23 U/L (ref 15–41)
Albumin: 4.1 g/dL (ref 3.5–5.0)
Alkaline Phosphatase: 68 U/L (ref 38–126)
Anion gap: 11 (ref 5–15)
BUN: 17 mg/dL (ref 6–20)
CO2: 25 mmol/L (ref 22–32)
Calcium: 9.1 mg/dL (ref 8.9–10.3)
Chloride: 105 mmol/L (ref 98–111)
Creatinine, Ser: 1.06 mg/dL (ref 0.61–1.24)
GFR, Estimated: >60 mL/min (ref >60)
Glucose, Bld: 162 mg/dL — ABNORMAL HIGH (ref 70–99)
Potassium: 3.2 mmol/L — ABNORMAL LOW (ref 3.5–5.1)
Sodium: 141 mmol/L (ref 135–145)
Total Bilirubin: 0.4 mg/dL (ref 0.0–1.2)
Total Protein: 6.9 g/dL (ref 6.5–8.1)

## 2024-08-12 MED ORDER — OXYCODONE-ACETAMINOPHEN 5-325 MG PO TABS
1.0000 | ORAL_TABLET | Freq: Once | ORAL | Status: AC
  Administered 2024-08-12: 1 via ORAL
  Filled 2024-08-12: qty 1

## 2024-08-12 MED ORDER — KETOROLAC TROMETHAMINE 15 MG/ML IJ SOLN
15.0000 mg | Freq: Once | INTRAMUSCULAR | Status: AC
  Administered 2024-08-12: 15 mg via INTRAVENOUS
  Filled 2024-08-12: qty 1

## 2024-08-12 MED ORDER — TAMSULOSIN HCL 0.4 MG PO CAPS
0.4000 mg | ORAL_CAPSULE | Freq: Once | ORAL | Status: AC
  Administered 2024-08-12: 0.4 mg via ORAL
  Filled 2024-08-12: qty 1

## 2024-08-12 MED ORDER — ONDANSETRON 4 MG PO TBDP: 4.0000 mg | ORAL_TABLET | Freq: Three times a day (TID) | ORAL | 0 refills | Status: AC | PRN

## 2024-08-12 MED ORDER — TAMSULOSIN HCL 0.4 MG PO CAPS: 0.4000 mg | ORAL_CAPSULE | Freq: Every day | ORAL | 0 refills | Status: AC

## 2024-08-12 MED ORDER — ONDANSETRON HCL 4 MG/2ML IJ SOLN
4.0000 mg | Freq: Once | INTRAMUSCULAR | Status: AC
  Administered 2024-08-12: 4 mg via INTRAVENOUS
  Filled 2024-08-12: qty 2

## 2024-08-12 MED ORDER — HYDROMORPHONE HCL 1 MG/ML IJ SOLN
0.5000 mg | Freq: Once | INTRAMUSCULAR | Status: AC
  Administered 2024-08-12: 0.5 mg via INTRAVENOUS
  Filled 2024-08-12: qty 0.5

## 2024-08-12 MED ORDER — OXYCODONE-ACETAMINOPHEN 5-325 MG PO TABS: 1.0000 | ORAL_TABLET | Freq: Four times a day (QID) | ORAL | 0 refills | Status: AC | PRN

## 2024-08-12 MED ORDER — LACTATED RINGERS IV BOLUS
1000.0000 mL | Freq: Once | INTRAVENOUS | Status: AC
  Administered 2024-08-12: 1000 mL via INTRAVENOUS

## 2024-08-12 MED ORDER — POTASSIUM CHLORIDE CRYS ER 20 MEQ PO TBCR
40.0000 meq | EXTENDED_RELEASE_TABLET | Freq: Once | ORAL | Status: AC
  Administered 2024-08-12: 40 meq via ORAL
  Filled 2024-08-12: qty 2

## 2024-08-12 NOTE — Discharge Instructions (Addendum)
-  You have a 3 mm kidney stone in the lower portion of your left ureter.  This should pass on its own but will be painful in the meantime.   -Prescriptions were sent to your pharmacy: -Take tamsulosin  daily to help relax smooth muscles and help pass stone.  You were given today's dose in the ED. -Take ondansetron  as needed for nausea -For pain you should be taking over-the-counter ibuprofen and Tylenol .  A prescription for narcotic pain medication was sent to your pharmacy.  Take this only as needed. -Drink plenty of fluids to help pass stone. -Call the telephone number below to set up a follow-up appointment with urology. -Return to the emergency department for any new or worsening symptoms of concern.

## 2024-08-12 NOTE — ED Triage Notes (Signed)
 Patient from home for LLQ abd pain that radiates into his back that started about 2 hours ago. Reports N/V. Denies constipation, diarrhea, or urinary changes. Upon arrival to ER, patient is alert and oriented, ambu; in apparent discomfort

## 2024-08-12 NOTE — ED Notes (Signed)
 ED Provider at bedside.

## 2024-08-12 NOTE — ED Notes (Signed)
 Patient transported to CT

## 2024-08-12 NOTE — ED Provider Notes (Signed)
 [place] EMERGENCY DEPARTMENT AT [facility] Provider Note   CSN: [id number] Arrival date & time: 08/12/24  9487     Patient presents with: Abdominal Pain   Garrett Lee is a 28 y.o. male.   HPI Patient presents for left flank pain.  Medical history includes HTN, DM, HLD.  2 hours prior to arrival, patient was awakened from sleep with a pain that is located in the left side of his abdomen and radiating to the left side of his back.  He has had nausea and vomiting.  He vomited approximately 5 times prior to arrival.  He denies any recent urinary symptoms.  He has been having normal bowel movements.    Prior to Admission medications   Medication Sig Start Date End Date Taking? Authorizing Provider  ondansetron  (ZOFRAN -ODT) 4 MG disintegrating tablet Take 1 tablet (4 mg total) by mouth every 8 (eight) hours as needed. 08/12/24  Yes Melvenia Motto, MD  oxyCODONE -acetaminophen  (PERCOCET/ROXICET) 5-325 MG tablet Take 1 tablet by mouth every 6 (six) hours as needed for up to 3 days for severe pain (pain score 7-10). 08/12/24 08/15/24 Yes Melvenia Motto, MD  tamsulosin  (FLOMAX ) 0.4 MG CAPS capsule Take 1 capsule (0.4 mg total) by mouth daily for 5 days. 08/12/24 08/17/24 Yes Melvenia Motto, MD  Dulaglutide  (TRULICITY ) 4.5 MG/0.5ML SOAJ Inject 4.5 mg as directed once a week. 11/08/23   Alphonsa Glendia LABOR, MD  escitalopram  (LEXAPRO ) 20 MG tablet Take 1 tablet (20 mg total) by mouth daily. 05/15/24   Alphonsa Glendia LABOR, MD  metFORMIN  (GLUCOPHAGE -XR) 500 MG 24 hr tablet TAKE ONE TABLET BY MOUTH EVERY DAY 05/15/24   Alphonsa Glendia LABOR, MD  rosuvastatin  (CRESTOR ) 10 MG tablet Take 1 tablet (10 mg total) by mouth daily. 11/08/23   Alphonsa Glendia LABOR, MD  valsartan -hydrochlorothiazide  (DIOVAN -HCT) 160-25 MG tablet Take 1 tablet by mouth daily. 05/11/24   Cook, Jayce G, DO  varenicline  (CHANTIX ) 1 MG tablet Take 1 tablet (1 mg total) by mouth 2 (two) times daily. 05/11/24   Cook, Jayce G, DO  Varenicline  Tartrate,  Starter, (CHANTIX  STARTING MONTH PAK) 0.5 MG X 11 & 1 MG X 42 TBPK Days 1 to 3: 0.5 mg once daily. Days 4 to 7: 0.5 mg twice daily. Maintenance (day 8 and later): 1 mg twice daily. 05/11/24   Cook, Jayce G, DO    Allergies: Patient has no known allergies.    Review of Systems  Gastrointestinal:  Positive for abdominal pain, nausea and vomiting.  Genitourinary:  Positive for flank pain.  Musculoskeletal:  Positive for back pain.  All other systems reviewed and are negative.   Updated Vital Signs BP (!) 158/103 (BP Location: Right Arm)   Pulse 69   Temp 97.7 F (36.5 C) (Oral)   Resp 20   Ht 5' 11 (1.803 m)   Wt 113.4 kg   SpO2 93%   BMI 34.87 kg/m   Physical Exam Vitals and nursing note reviewed.  Constitutional:      General: He is not in acute distress.    Appearance: He is well-developed. He is not ill-appearing, toxic-appearing or diaphoretic.  HENT:     Head: Normocephalic and atraumatic.     Mouth/Throat:     Mouth: Mucous membranes are moist.  Eyes:     Conjunctiva/sclera: Conjunctivae normal.  Cardiovascular:     Rate and Rhythm: Normal rate and regular rhythm.  Pulmonary:     Effort: Pulmonary effort is normal. No respiratory distress.  Abdominal:     Palpations: Abdomen is soft.     Tenderness: There is no abdominal tenderness.  Musculoskeletal:        General: No swelling.     Cervical back: Neck supple.  Skin:    General: Skin is warm and dry.  Neurological:     General: No focal deficit present.     Mental Status: He is alert and oriented to person, place, and time.  Psychiatric:        Mood and Affect: Mood normal.        Behavior: Behavior normal.     (all labs ordered are listed, but only abnormal results are displayed) Labs Reviewed  COMPREHENSIVE METABOLIC PANEL WITH GFR - Abnormal; Notable for the following components:      Result Value   Potassium 3.2 (*)    Glucose, Bld 162 (*)    All other components within normal limits  LIPASE,  BLOOD - Abnormal; Notable for the following components:   Lipase 133 (*)    All other components within normal limits  CBC WITH DIFFERENTIAL/PLATELET - Abnormal; Notable for the following components:   WBC 15.6 (*)    Neutro Abs 9.5 (*)    Eosinophils Absolute 2.3 (*)    All other components within normal limits  URINALYSIS, ROUTINE W REFLEX MICROSCOPIC    EKG: None  Radiology: CT RENAL STONE STUDY Result Date: 08/12/2024 EXAM: CT ABDOMEN AND PELVIS WITHOUT CONTRAST 08/12/2024 05:52:07 AM TECHNIQUE: CT of the abdomen and pelvis was performed without the administration of intravenous contrast. Multiplanar reformatted images are provided for review. Automated exposure control, iterative reconstruction, and/or weight based adjustment of the mA/kV was utilized to reduce the radiation dose to as low as reasonably achievable. COMPARISON: None available. CLINICAL HISTORY: Abdominal/flank pain, stone suspected. Patient from home for LLQ abd pain that radiates into his back that started about 2 hours ago. Reports N/V. Denies constipation, diarrhea, or urinary changes. Upon arrival to ER, patient is alert and oriented, ambu; in apparent discomfo FINDINGS: LOWER CHEST: Scattered areas of air trapping identified within the lung bases. Mild bronchial wall thickening. There are a few tiny lung nodules identified within the left lung base which are unchanged from the previous exam compatible with a benign process. Index nodule in the left base measures 5 mm, image 11/2. LIVER: The liver is unremarkable. GALLBLADDER AND BILE DUCTS: Gallbladder is unremarkable. No biliary ductal dilatation. SPLEEN: No acute abnormality. PANCREAS: No acute abnormality. ADRENAL GLANDS: No acute abnormality. KIDNEYS, URETERS AND BLADDER: Left-sided hydronephrosis and hydroureter identified. Punctate stone within the distal left ureter just before the bladder measures 3 mm, coronal image 55/5. No stones in the right kidney or ureter. No  hydronephrosis on the right. No perinephric or periureteral stranding. Urinary bladder is unremarkable. GI AND BOWEL: The appendix is visualized and appears normal. Stomach demonstrates no acute abnormality. There is no bowel obstruction. No bowel wall thickening. PERITONEUM AND RETROPERITONEUM: No ascites. No free air. VASCULATURE: Aorta is normal in caliber. LYMPH NODES: No lymphadenopathy. REPRODUCTIVE ORGANS: No acute abnormality. BONES AND SOFT TISSUES: Mild endplate degenerative changes noted in the lower thoracic and lumbar spine. No acute osseous abnormality. No focal soft tissue abnormality. IMPRESSION: 1. Left-sided hydronephrosis and hydroureter with a 3 mm punctate stone in the distal left ureter just before the bladder, likely causing the patient's symptoms. Electronically signed by: Waddell Calk MD 08/12/2024 06:20 AM EDT RP Workstation: [id number]     Procedures   Medications Ordered in  the ED  tamsulosin  (FLOMAX ) capsule 0.4 mg (has no administration in time range)  potassium chloride  SA (KLOR-CON  M) CR tablet 40 mEq (has no administration in time range)  HYDROmorphone  (DILAUDID ) injection 0.5 mg (0.5 mg Intravenous Given 08/12/24 0538)  ondansetron  (ZOFRAN ) injection 4 mg (4 mg Intravenous Given 08/12/24 0537)  ketorolac  (TORADOL ) 15 MG/ML injection 15 mg (15 mg Intravenous Given 08/12/24 0539)  lactated ringers  bolus 1,000 mL (0 mLs Intravenous Stopped 08/12/24 0645)  oxyCODONE -acetaminophen  (PERCOCET/ROXICET) 5-325 MG per tablet 1 tablet (1 tablet Oral Given 08/12/24 9355)                                    Medical Decision Making Amount and/or Complexity of Data Reviewed Labs: ordered. Radiology: ordered.  Risk Prescription drug management.   This patient presents to the ED for concern of left flank pain, this involves an extensive number of treatment options, and is a complaint that carries with it a high risk of complications and morbidity.  The differential diagnosis  includes nephrolithiasis, constipation, colitis, musculoskeletal etiology   Co morbidities / Chronic conditions that complicate the patient evaluation  HTN, DM, HLD   Additional history obtained:  Additional history obtained from EMR External records from outside source obtained and reviewed including N/A   Lab Tests:  I Ordered, and personally interpreted labs.  The pertinent results include: Leukocytosis and mild hypokalemia are present.  Lipase is mildly elevated but not consistent with pancreatitis.  Lab work is otherwise unremarkable.  Urinalysis was pending at time of signout.   Imaging Studies ordered:  I ordered imaging studies including CT stone study I independently visualized and interpreted imaging which showed 3 mm distal left ureteral stone I agree with the radiologist interpretation   Cardiac Monitoring: / EKG:  The patient was maintained on a cardiac monitor.  I personally viewed and interpreted the cardiac monitored which showed an underlying rhythm of: Sinus rhythm   Problem List / ED Course / Critical interventions / Medication management  Patient presents for pain in area of left abdomen, left flank, left back.  Onset was 2 hours prior to arrival.  It did wake him up from sleep.  On arrival, vital signs notable for moderate hypertension.  Patient appears uncomfortable on exam.  He has no significant tenderness.  Toradol , Dilaudid , and Zofran  ordered for symptomatic relief.  IV fluids ordered for hydration.  Workup was initiated.  Lab work notable for leukocytosis and hypokalemia.  Replacement potassium was ordered.  CT imaging confirms left-sided ureteral stone.  At time of imaging, stone was 3 mm and near distal end of ureter.  Patient has high likelihood of passing this stone.  He did have improved symptoms while in the ED.  Urinalysis was pending at time of signout.  Care of patient signed out to oncoming ED provider. I ordered medication including IV fluids  for hydration; Percocet, Toradol , Dilaudid  for analgesia; Zofran  for nausea; potassium chloride  for hypokalemia; tamsulosin  for kidney stone Reevaluation of the patient after these medicines showed that the patient improved I have reviewed the patients home medicines and have made adjustments as needed  Social Determinants of Health:  Lives independently     Final diagnoses:  Kidney stone    ED Discharge Orders          Ordered    oxyCODONE -acetaminophen  (PERCOCET/ROXICET) 5-325 MG tablet  Every 6 hours PRN  08/12/24 0655    tamsulosin  (FLOMAX ) 0.4 MG CAPS capsule  Daily        08/12/24 0655    ondansetron  (ZOFRAN -ODT) 4 MG disintegrating tablet  Every 8 hours PRN        08/12/24 0655               Melvenia Motto, MD 08/12/24 0710

## 2024-08-17 ENCOUNTER — Ambulatory Visit: Admitting: Family Medicine

## 2024-08-17 VITALS — BP 124/86 | HR 84 | Temp 97.9°F | Ht 71.0 in | Wt 250.2 lb

## 2024-08-17 DIAGNOSIS — F909 Attention-deficit hyperactivity disorder, unspecified type: Secondary | ICD-10-CM

## 2024-08-17 DIAGNOSIS — E1169 Type 2 diabetes mellitus with other specified complication: Secondary | ICD-10-CM

## 2024-08-17 DIAGNOSIS — E119 Type 2 diabetes mellitus without complications: Secondary | ICD-10-CM | POA: Diagnosis not present

## 2024-08-17 DIAGNOSIS — Z79899 Other long term (current) drug therapy: Secondary | ICD-10-CM

## 2024-08-17 DIAGNOSIS — I10 Essential (primary) hypertension: Secondary | ICD-10-CM | POA: Diagnosis not present

## 2024-08-17 DIAGNOSIS — R748 Abnormal levels of other serum enzymes: Secondary | ICD-10-CM

## 2024-08-17 DIAGNOSIS — E785 Hyperlipidemia, unspecified: Secondary | ICD-10-CM

## 2024-08-17 DIAGNOSIS — F17200 Nicotine dependence, unspecified, uncomplicated: Secondary | ICD-10-CM

## 2024-08-17 DIAGNOSIS — Z7984 Long term (current) use of oral hypoglycemic drugs: Secondary | ICD-10-CM

## 2024-08-17 MED ORDER — AMPHETAMINE-DEXTROAMPHET ER 10 MG PO CP24: 10.0000 mg | ORAL_CAPSULE | Freq: Every day | ORAL | 0 refills | Status: DC

## 2024-08-17 MED ORDER — VARENICLINE TARTRATE (STARTER) 0.5 MG X 11 & 1 MG X 42 PO TBPK: ORAL_TABLET | ORAL | 0 refills | Status: DC

## 2024-08-17 MED ORDER — VARENICLINE TARTRATE 1 MG PO TABS: 1.0000 mg | ORAL_TABLET | Freq: Two times a day (BID) | ORAL | 2 refills | Status: DC

## 2024-08-17 NOTE — Progress Notes (Signed)
 Subjective:    Patient ID: Garrett Lee, male    DOB: Mar 15, 1996, 28 y.o.   MRN: [id number]  HPI The patient was seen today as part of a comprehensive diabetic check up. Patient has diabetes Patient relates good compliance with taking the medication. We discussed their diet and exercise activities  We also discussed the importance of notifying us  if any excessively high glucoses or low sugars.    Patient for blood pressure check up.  The patient does have hypertension.   Patient relates dietary measures try to minimize salt The importance of healthy diet and activity were discussed Patient relates compliance  Patient here for follow-up regarding cholesterol.    Patient relates taking medication on a regular basis Denies problems with medication Importance of dietary measures discussed Regular lab work regarding lipid and liver was checked and if needing additional labs was appropriately ordered  Patient with new onset of difficult time focusing and staying on track This is actually been going on ever since elementary school he struggled through middle school in high school He has a hard time staying on track He gets distracted easily He often spends multiple parts of his day trying to complete multiple small aspects and then he gets distracted and has a hard time going back and completing them His wife points this out It is also apparent to some degree outside of the home Question there leads strongly for ADD   Review of Systems     Objective:   Physical Exam General-in no acute distress Eyes-no discharge Lungs-respiratory rate normal, CTA CV-no murmurs,RRR Extremities skin warm dry no edema Neuro grossly normal Behavior normal, alert        Assessment & Plan:   1. Type 2 diabetes mellitus without complication, without long-term current use of insulin (HCC) (Primary) The patient was seen today as part of a comprehensive visit for diabetes. The importance of  keeping her A1c at or below 7 range was discussed.  Discussed diet, activity, and medication compliance Emphasized healthy eating primarily with vegetables fruits and if utilizing meats lean meats such as chicken or fish grilled baked broiled Avoid sugary drinks Minimize and avoid processed foods Fit in regular physical activity preferably 25 to 30 minutes 4 times per week Standard follow-up visit recommended.  Patient aware lack of control and follow-up increases risk of diabetic complications. Regular follow-up visits Yearly ophthalmology Yearly foot exam  Patient will do A1c he may need adjustments of medicines - Hemoglobin A1c  2. Elevated liver enzymes Portion control regular physical activity will monitor accordingly  3. Hypertension, unspecified type Blood pressure good control continue current measures  4. Hyperlipidemia associated with type 2 diabetes mellitus (HCC) Previous lipid profile looks good continue current measures healthy diet  5. Attention deficit hyperactivity disorder (ADHD), unspecified ADHD type Proper evaluation Discussion regarding medications for ADD and what to expect Even in the best of circumstances only 40 or 50% improvement with medication Patient is on board with trying medication We will go ahead with Adderall 10 mg daily XR Side effects discussed He will give us  feedback in 3 to 4 weeks how that is doing  6. Smoker Side effects discussed patient has used before he is requesting refills he is trying his best to quit - Varenicline  Tartrate, Starter, (CHANTIX  STARTING MONTH PAK) 0.5 MG X 11 & 1 MG X 42 TBPK; Days 1 to 3: 0.5 mg once daily. Days 4 to 7: 0.5 mg twice daily. Maintenance (day 8 and  later): 1 mg twice daily.  Dispense: 53 each; Refill: 0 - varenicline  (CHANTIX ) 1 MG tablet; Take 1 tablet (1 mg total) by mouth 2 (two) times daily.  Dispense: 180 tablet; Refill: 2  7. High risk medication use Liver panel as directed - Hepatic Function  Panel  Follow-up 4 months for ADD diabetes

## 2024-08-18 LAB — HEPATIC FUNCTION PANEL
ALT: 42 IU/L (ref 0–44)
AST: 25 IU/L (ref 0–40)
Albumin: 4.7 g/dL (ref 4.3–5.2)
Alkaline Phosphatase: 92 IU/L (ref 44–121)
Bilirubin Total: 0.3 mg/dL (ref 0.0–1.2)
Bilirubin, Direct: 0.1 mg/dL (ref 0.00–0.40)
Total Protein: 7.2 g/dL (ref 6.0–8.5)

## 2024-08-18 LAB — HEMOGLOBIN A1C
Est. average glucose Bld gHb Est-mCnc: 120 mg/dL
Hgb A1c MFr Bld: 5.8 % — ABNORMAL HIGH (ref 4.8–5.6)

## 2024-08-19 ENCOUNTER — Ambulatory Visit: Payer: Self-pay | Admitting: Family Medicine

## 2024-09-17 ENCOUNTER — Other Ambulatory Visit: Payer: Self-pay | Admitting: Family Medicine

## 2024-09-17 MED ORDER — AMPHETAMINE-DEXTROAMPHET ER 20 MG PO CP24
20.0000 mg | ORAL_CAPSULE | Freq: Every day | ORAL | 0 refills | Status: DC
Start: 2024-09-17 — End: 2024-11-06

## 2024-09-22 ENCOUNTER — Other Ambulatory Visit: Payer: Self-pay | Admitting: Family Medicine

## 2024-09-22 DIAGNOSIS — I10 Essential (primary) hypertension: Secondary | ICD-10-CM

## 2024-11-05 ENCOUNTER — Encounter: Payer: Self-pay | Admitting: Family Medicine

## 2024-11-06 ENCOUNTER — Other Ambulatory Visit: Payer: Self-pay | Admitting: Family Medicine

## 2024-11-06 MED ORDER — AMPHETAMINE-DEXTROAMPHET ER 25 MG PO CP24: 25.0000 mg | ORAL_CAPSULE | ORAL | 0 refills | Status: DC

## 2024-12-11 ENCOUNTER — Ambulatory Visit: Admitting: Family Medicine

## 2024-12-11 VITALS — BP 139/89 | Ht 71.0 in | Wt 282.4 lb

## 2024-12-11 DIAGNOSIS — E1169 Type 2 diabetes mellitus with other specified complication: Secondary | ICD-10-CM

## 2024-12-11 DIAGNOSIS — Z79899 Other long term (current) drug therapy: Secondary | ICD-10-CM

## 2024-12-11 DIAGNOSIS — I10 Essential (primary) hypertension: Secondary | ICD-10-CM | POA: Diagnosis not present

## 2024-12-11 DIAGNOSIS — Z7985 Long-term (current) use of injectable non-insulin antidiabetic drugs: Secondary | ICD-10-CM

## 2024-12-11 DIAGNOSIS — F909 Attention-deficit hyperactivity disorder, unspecified type: Secondary | ICD-10-CM

## 2024-12-11 DIAGNOSIS — E785 Hyperlipidemia, unspecified: Secondary | ICD-10-CM

## 2024-12-11 DIAGNOSIS — E119 Type 2 diabetes mellitus without complications: Secondary | ICD-10-CM

## 2024-12-11 MED ORDER — AMPHETAMINE-DEXTROAMPHET ER 25 MG PO CP24: 25.0000 mg | ORAL_CAPSULE | ORAL | 0 refills | Status: AC

## 2024-12-11 MED ORDER — AMLODIPINE BESYLATE 2.5 MG PO TABS: 2.5000 mg | ORAL_TABLET | Freq: Every day | ORAL | 1 refills | Status: AC

## 2024-12-11 MED ORDER — METFORMIN HCL ER 500 MG PO TB24: 500.0000 mg | ORAL_TABLET | Freq: Every day | ORAL | 1 refills | Status: AC

## 2024-12-11 MED ORDER — VALSARTAN-HYDROCHLOROTHIAZIDE 160-25 MG PO TABS: 1.0000 | ORAL_TABLET | Freq: Every day | ORAL | 1 refills | Status: AC

## 2024-12-11 MED ORDER — SERTRALINE HCL 100 MG PO TABS: 100.0000 mg | ORAL_TABLET | Freq: Every day | ORAL | 1 refills | Status: AC

## 2024-12-11 MED ORDER — TIRZEPATIDE 2.5 MG/0.5ML ~~LOC~~ SOAJ: 2.5000 mg | SUBCUTANEOUS | 4 refills | Status: DC

## 2024-12-11 MED ORDER — ROSUVASTATIN CALCIUM 10 MG PO TABS: 10.0000 mg | ORAL_TABLET | Freq: Every day | ORAL | 3 refills | Status: AC

## 2024-12-11 NOTE — Progress Notes (Signed)
° °  Subjective:    Patient ID: Garrett Lee, male    DOB: August 06, 1996, 28 y.o.   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 48 hours but he works a lot of extra hours plus also is married and has Animator very busy with everything good stressed as well Not suicidal but at times he does feel that if he died of natural causes that would be okay Patient does not feel like the Lexapro  is helping him the way he used to He is trying to deal with life as best as possible Also has diabetes States Trulicity  causing nausea and abdominal pressure as well as severe burping he would like to try different GLP-1 if possible    Review of Systems     Objective:   Physical Exam  General-in no acute distress Eyes-no discharge Lungs-respiratory rate normal, CTA CV-no murmurs,RRR Extremities skin warm dry no edema Neuro grossly normal Behavior normal, alert       Assessment & Plan:  1. Essential hypertension Blood pressure is subpar control adjust medication add amlodipine  2.5 patient to give us  feedback in a few weeks time how it is doing goal is 130/80 or less - valsartan -hydrochlorothiazide  (DIOVAN -HCT) 160-25 MG tablet; Take 1 tablet by mouth daily.  Dispense: 90 tablet; Refill: 1  2. Type 2 diabetes mellitus without complication, without long-term current use of insulin (HCC) (Primary) Previous A1c looked good Patient is not tolerating Trulicity  causing abdominal discomfort some nausea and also belching He would like to try different GLP-1 - Basic metabolic panel with GFR - Hemoglobin A1c  3. Hyperlipidemia associated with type 2 diabetes mellitus (HCC) Goal is to get LDL below 70 continue medication continue statin check lab work - Lipid Panel  4. Attention deficit hyperactivity disorder (ADHD), unspecified ADHD type 3  scripts given The patient was seen today as part of the visit regarding ADD.  Patient is stable on current regimen.  Appropriate prescriptions prescribed.  Medications were reviewed with the patient as well as compliance. Side effects were checked for. Discussion regarding effectiveness was held. Prescriptions were electronically sent in.  Patient reminded to follow-up in approximately 3 months.   Plans to   law with drug registry was checked and verified while present with the patient. PDMP checked   5. Morbid obesity (HCC) Portion control regular physical activity try to lose weight healthier diet recommended  6. High risk medication use Labs recommended - Hepatic function panel  Follow-up 4 months sooner problems  Stress related issues will come off of the Lexapro  and trying sertraline  100 mg in its place patient will give us  feedback in a few weeks Patient states he is not suicidal Does not have any suicidal ideation Relates if he starts feeling that way he will follow-up immediately

## 2024-12-12 ENCOUNTER — Other Ambulatory Visit: Payer: Self-pay | Admitting: Family Medicine

## 2024-12-12 ENCOUNTER — Ambulatory Visit: Payer: Self-pay | Admitting: Family Medicine

## 2024-12-12 LAB — LIPID PANEL
Chol/HDL Ratio: 8.3 ratio — ABNORMAL HIGH (ref 0.0–5.0)
Cholesterol, Total: 215 mg/dL — ABNORMAL HIGH (ref 100–199)
HDL: 26 mg/dL — ABNORMAL LOW (ref >39)
Triglycerides: 879 mg/dL (ref 0–149)

## 2024-12-12 LAB — BASIC METABOLIC PANEL WITH GFR
BUN/Creatinine Ratio: 16 (ref 9–20)
BUN: 14 mg/dL (ref 6–20)
CO2: 25 mmol/L (ref 20–29)
Calcium: 9.6 mg/dL (ref 8.7–10.2)
Chloride: 101 mmol/L (ref 96–106)
Creatinine, Ser: 0.87 mg/dL (ref 0.76–1.27)
Glucose: 160 mg/dL — ABNORMAL HIGH (ref 70–99)
Potassium: 4.8 mmol/L (ref 3.5–5.2)
Sodium: 140 mmol/L (ref 134–144)
eGFR: 119 mL/min/1.73 (ref >59)

## 2024-12-12 LAB — HEMOGLOBIN A1C
Est. average glucose Bld gHb Est-mCnc: 203 mg/dL
Hgb A1c MFr Bld: 8.7 % — ABNORMAL HIGH (ref 4.8–5.6)

## 2024-12-12 LAB — HEPATIC FUNCTION PANEL
ALT: 87 IU/L — ABNORMAL HIGH (ref 0–44)
AST: 44 IU/L — ABNORMAL HIGH (ref 0–40)
Albumin: 4.4 g/dL (ref 4.3–5.2)
Alkaline Phosphatase: 94 IU/L (ref 47–123)
Bilirubin Total: 0.3 mg/dL (ref 0.0–1.2)
Bilirubin, Direct: 0.1 mg/dL (ref 0.00–0.40)
Total Protein: 6.4 g/dL (ref 6.0–8.5)

## 2024-12-12 MED ORDER — FENOFIBRATE 160 MG PO TABS: 160.0000 mg | ORAL_TABLET | Freq: Every day | ORAL | 1 refills | Status: AC

## 2025-01-02 ENCOUNTER — Other Ambulatory Visit: Payer: Self-pay | Admitting: Family Medicine

## 2025-01-02 ENCOUNTER — Encounter: Payer: Self-pay | Admitting: Family Medicine

## 2025-01-02 DIAGNOSIS — Z79899 Other long term (current) drug therapy: Secondary | ICD-10-CM

## 2025-01-02 DIAGNOSIS — E119 Type 2 diabetes mellitus without complications: Secondary | ICD-10-CM

## 2025-01-02 DIAGNOSIS — E1169 Type 2 diabetes mellitus with other specified complication: Secondary | ICD-10-CM

## 2025-01-02 DIAGNOSIS — I10 Essential (primary) hypertension: Secondary | ICD-10-CM

## 2025-01-04 ENCOUNTER — Other Ambulatory Visit: Payer: Self-pay | Admitting: Family Medicine

## 2025-01-04 MED ORDER — TIRZEPATIDE 5 MG/0.5ML ~~LOC~~ SOAJ: 5.0000 mg | SUBCUTANEOUS | 4 refills | Status: AC

## 2025-01-04 NOTE — Telephone Encounter (Signed)
 Pt states he received prescription and meds are so far so good

## 2025-04-11 ENCOUNTER — Ambulatory Visit: Admitting: Family Medicine
